# Patient Record
Sex: Female | Born: 1995
Health system: Southern US, Community
[De-identification: ages and names within clinical notes are randomized; demographics above are authoritative.]

## PROBLEM LIST (undated history)

## (undated) DIAGNOSIS — F419 Anxiety disorder, unspecified: Secondary | ICD-10-CM

## (undated) DIAGNOSIS — M51369 Other intervertebral disc degeneration, lumbar region without mention of lumbar back pain or lower extremity pain: Secondary | ICD-10-CM

## (undated) DIAGNOSIS — F32A Depression, unspecified: Secondary | ICD-10-CM

## (undated) DIAGNOSIS — N946 Dysmenorrhea, unspecified: Secondary | ICD-10-CM

## (undated) DIAGNOSIS — M5136 Other intervertebral disc degeneration, lumbar region: Secondary | ICD-10-CM

## (undated) DIAGNOSIS — R519 Headache, unspecified: Secondary | ICD-10-CM

## (undated) DIAGNOSIS — R51 Headache: Secondary | ICD-10-CM

## (undated) HISTORY — DX: Other intervertebral disc degeneration, lumbar region: M51.36

## (undated) HISTORY — DX: Depression, unspecified: F32.A

## (undated) HISTORY — DX: Other intervertebral disc degeneration, lumbar region without mention of lumbar back pain or lower extremity pain: M51.369

## (undated) HISTORY — DX: Headache, unspecified: R51.9

## (undated) HISTORY — DX: Headache: R51

## (undated) HISTORY — DX: Anxiety disorder, unspecified: F41.9

## (undated) HISTORY — DX: Dysmenorrhea, unspecified: N94.6

---
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
## 2015-01-02 ENCOUNTER — Ambulatory Visit: Payer: Managed Care, Other (non HMO)

## 2015-01-02 ENCOUNTER — Encounter: Payer: Self-pay | Admitting: *Deleted

## 2015-01-04 ENCOUNTER — Other Ambulatory Visit: Payer: Self-pay | Admitting: Obstetrics and Gynecology

## 2015-01-04 ENCOUNTER — Ambulatory Visit (INDEPENDENT_AMBULATORY_CARE_PROVIDER_SITE_OTHER): Payer: Managed Care, Other (non HMO) | Admitting: Obstetrics and Gynecology

## 2015-01-04 ENCOUNTER — Telehealth: Payer: Self-pay | Admitting: *Deleted

## 2015-01-04 VITALS — BP 112/62 | HR 99 | Wt 162.1 lb

## 2015-01-04 DIAGNOSIS — L709 Acne, unspecified: Secondary | ICD-10-CM | POA: Insufficient documentation

## 2015-01-04 DIAGNOSIS — Z3049 Encounter for surveillance of other contraceptives: Secondary | ICD-10-CM

## 2015-01-04 DIAGNOSIS — Z3042 Encounter for surveillance of injectable contraceptive: Secondary | ICD-10-CM

## 2015-01-04 MED ORDER — MEDROXYPROGESTERONE ACETATE 150 MG/ML IM SUSP
150.0000 mg | Freq: Once | INTRAMUSCULAR | Status: AC
  Administered 2015-01-04: 150 mg via INTRAMUSCULAR

## 2015-01-04 MED ORDER — CLINDAMYCIN PHOSPHATE 1 % EX GEL: Freq: Two times a day (BID) | CUTANEOUS | Status: DC

## 2015-01-04 NOTE — Progress Notes (Cosign Needed)
Pt is here for depo provera inj She is doing well, has had some increased acne on her face would like rx for that if possible

## 2015-01-04 NOTE — Telephone Encounter (Signed)
Please let her know it was sent in- a gel to be applied at bedtime and washed off in the morning, can reuse in am if needed, but sometimes it dries skin too much then.

## 2015-01-04 NOTE — Telephone Encounter (Signed)
Notified pt she voiced understanding 

## 2015-01-04 NOTE — Telephone Encounter (Signed)
Hey Virginia Jackson is having some increased acne on her face would like something sent in for this Maybe a cream instead of pill???

## 2015-01-09 ENCOUNTER — Telehealth: Payer: Self-pay | Admitting: Obstetrics and Gynecology

## 2015-01-09 NOTE — Telephone Encounter (Signed)
Virginia Jackson got the depo inj last week. She is bleeding heavier since she got it and wanted to know if this is normal. Please call after 1 pm and leave a msg if she don't answer.

## 2015-01-23 ENCOUNTER — Telehealth: Payer: Self-pay | Admitting: Obstetrics and Gynecology

## 2015-01-23 NOTE — Telephone Encounter (Signed)
Virginia Jackson gave her some gel for her acne. It's not working, Virginia Jackson said she would go to something stronger or somethng else.

## 2015-01-23 NOTE — Telephone Encounter (Signed)
[facility] [place]

## 2015-01-25 NOTE — Telephone Encounter (Signed)
Bleeding unfortunately can be normal, if she does it again after next shot, I want her to get them a week earlier. For the acne medicine, I think she needs to see a dermatologist if the meds aren't helping at all. i can send in a referral if she desires.

## 2015-01-27 ENCOUNTER — Other Ambulatory Visit: Payer: Self-pay | Admitting: Neurology

## 2015-01-27 NOTE — Telephone Encounter (Signed)
Notified pt. 

## 2015-01-27 NOTE — Telephone Encounter (Signed)
Rx sent 

## 2015-02-20 NOTE — Telephone Encounter (Signed)
Advised pt she needs to see dermatology

## 2015-03-22 ENCOUNTER — Other Ambulatory Visit: Payer: Self-pay | Admitting: Obstetrics and Gynecology

## 2015-03-24 ENCOUNTER — Ambulatory Visit: Payer: Managed Care, Other (non HMO)

## 2015-03-27 ENCOUNTER — Ambulatory Visit (INDEPENDENT_AMBULATORY_CARE_PROVIDER_SITE_OTHER): Payer: Managed Care, Other (non HMO) | Admitting: Obstetrics and Gynecology

## 2015-03-27 VITALS — BP 124/78 | HR 88 | Wt 162.9 lb

## 2015-03-27 DIAGNOSIS — Z3049 Encounter for surveillance of other contraceptives: Secondary | ICD-10-CM

## 2015-03-27 DIAGNOSIS — Z3042 Encounter for surveillance of injectable contraceptive: Secondary | ICD-10-CM

## 2015-03-27 MED ORDER — MEDROXYPROGESTERONE ACETATE 150 MG/ML IM SUSP
150.0000 mg | Freq: Once | INTRAMUSCULAR | Status: AC
  Administered 2015-03-27: 150 mg via INTRAMUSCULAR

## 2015-03-27 NOTE — Progress Notes (Cosign Needed)
Pt is here for wt, bp check, b-12 inj Denies any s/e, she is doing well, [place]

## 2015-06-07 ENCOUNTER — Other Ambulatory Visit: Payer: Self-pay | Admitting: Neurology

## 2015-06-07 NOTE — Telephone Encounter (Signed)
Last OV: 12/21/14 Next OV: 07/21/15

## 2015-06-21 ENCOUNTER — Other Ambulatory Visit (INDEPENDENT_AMBULATORY_CARE_PROVIDER_SITE_OTHER): Payer: 59

## 2015-06-21 ENCOUNTER — Ambulatory Visit (INDEPENDENT_AMBULATORY_CARE_PROVIDER_SITE_OTHER): Payer: 59 | Admitting: Neurology

## 2015-06-21 ENCOUNTER — Encounter: Payer: Self-pay | Admitting: Neurology

## 2015-06-21 VITALS — BP 116/74 | HR 92 | Ht 63.0 in | Wt 159.0 lb

## 2015-06-21 DIAGNOSIS — G43009 Migraine without aura, not intractable, without status migrainosus: Secondary | ICD-10-CM

## 2015-06-21 LAB — BASIC METABOLIC PANEL
BUN: 19 mg/dL (ref 6–23)
CO2: 21 mEq/L (ref 19–32)
Calcium: 9.6 mg/dL (ref 8.4–10.5)
Chloride: 110 mEq/L (ref 96–112)
Creatinine, Ser: 0.96 mg/dL (ref 0.40–1.20)
GFR: 95.95 mL/min (ref >60.00)
Glucose, Bld: 91 mg/dL (ref 70–99)
Potassium: 4 mEq/L (ref 3.5–5.1)
Sodium: 139 mEq/L (ref 135–145)

## 2015-06-21 MED ORDER — ZOLMITRIPTAN 5 MG PO TABS: ORAL_TABLET | ORAL | Status: DC

## 2015-06-21 MED ORDER — TOPIRAMATE 50 MG PO TABS: 50.0000 mg | ORAL_TABLET | Freq: Every day | ORAL | Status: DC

## 2015-06-21 NOTE — Progress Notes (Addendum)
NEUROLOGY FOLLOW UP OFFICE NOTE  Virginia Jackson [id number]  HISTORY OF PRESENT ILLNESS: Virginia Jackson is a 20 year old right-handed female with anxiety who follows up for chronic migraine.  She is accompanied by her mother who provides some history.  UPDATE: She has not had any significant headache (mild or severe) since January.  She will have a very dull headache once in a while Current abortive medication:  Zomig 5mg  NS (it makes her feel sick) Anticonvulsant medications:  topiramate 50mg  Other medication:  Xanax   Caffeine:  no Alcohol:  no Smoker:  no Diet:  Eats healthy.  Drinks water Exercise:  routine Depression/stress:  stress Sleep hygiene:  Usually sleeps well but sleeps often during day because of headache  HISTORY: Onset:  childhood Location:  Varies (frontal, either side) Quality:  pounding Initial Intensity:  10/10 Aura:  Sometimes sees spots in her vision prior to headache onset Prodrome:  on Associated symptoms:  Nausea, photophobia, phonophobia;  Vomiting when severe. Initial Duration:  Until she falls asleep.  Severe episodes last 1 to 3 days Initial Frequency:  Daily.  Severe episodes occur once a month to once every other month. Triggers/exacerbating factors:  movement Relieving factors:  Sleep, vomiting, hot shower Activity:  Cannot function when severe (once a month to once every other month)  Past abortive medication:  Maxalt, sumatriptan po, Advil, Advil Migraine, Fioricet Past preventative medication:  none Other past therapy:  none  Current abortive medication:  Fioricet (daily), sometimes ibuprofen 600mg  Current preventative mediction:  Citalopram 10mg  Other therapy:  alprazolam 0.5mg  Other medication:  medroprogesterone acetate  Family history of headache:  mom  PAST MEDICAL HISTORY: Past Medical History  Diagnosis Date  . Headache   . Painful menstrual periods   . Anxiety     MEDICATIONS: Current Outpatient Prescriptions on  File Prior to Visit  Medication Sig Dispense Refill  . ALPRAZolam (XANAX) 0.5 MG tablet Reported on 03/27/2015  0  . clindamycin (CLINDAGEL) 1 % gel Apply topically 2 (two) times daily. 30 g 2  . ibuprofen (ADVIL,MOTRIN) 600 MG tablet Take 600 mg by mouth every 6 (six) hours as needed.    . medroxyPROGESTERone (DEPO-PROVERA) 150 MG/ML injection   0   No current facility-administered medications on file prior to visit.    ALLERGIES: No Known Allergies  FAMILY HISTORY: Family History  Problem Relation Age of Onset  . Hypertension Maternal Grandmother   . Hypertension Maternal Grandfather   . Cancer Maternal Grandfather     lymphoma   . Cancer Paternal Grandmother     ovarian   . Aneurysm Paternal Grandfather     SOCIAL HISTORY: Social History   Social History  . Marital Status: Single    Spouse Name: N/A  . Number of Children: N/A  . Years of Education: N/A   Occupational History  . Not on file.   Social History Main Topics  . Smoking status: Never Smoker   . Smokeless tobacco: Never Used  . Alcohol Use: No  . Drug Use: No  . Sexual Activity:    Partners: Male    Birth Control/ Protection: Injection   Other Topics Concern  . Not on file   Social History Narrative    REVIEW OF SYSTEMS: Constitutional: No fevers, chills, or sweats, no generalized fatigue, change in appetite Eyes: No visual changes, double vision, eye pain Ear, nose and throat: No hearing loss, ear pain, nasal congestion, sore throat Cardiovascular: No chest pain, palpitations Respiratory:  No shortness of breath at rest or with exertion, wheezes GastrointestinaI: No nausea, vomiting, diarrhea, abdominal pain, fecal incontinence Genitourinary:  No dysuria, urinary retention or frequency Musculoskeletal:  No neck pain, back pain Integumentary: No rash, pruritus, skin lesions Neurological: as above Psychiatric: No depression, insomnia, anxiety Endocrine: No palpitations, fatigue, diaphoresis,  mood swings, change in appetite, change in weight, increased thirst Hematologic/Lymphatic:  No anemia, purpura, petechiae. Allergic/Immunologic: no itchy/runny eyes, nasal congestion, recent allergic reactions, rashes  PHYSICAL EXAM: Filed Vitals:   06/21/15 1339  BP: 116/74  Pulse: 92   General: No acute distress.  Patient appears well-groomed.  normal body habitus. Head:  Normocephalic/atraumatic Eyes:  Fundoscopic exam unremarkable without vessel changes, exudates, hemorrhages or papilledema. Neck: supple, no paraspinal tenderness, full range of motion Heart:  Regular rate and rhythm Lungs:  Clear to auscultation bilaterally Back: No paraspinal tenderness Neurological Exam: alert and oriented to person, place, and time. Attention span and concentration intact, recent and remote memory intact, fund of knowledge intact.  Speech fluent and not dysarthric, language intact.  CN II-XII intact. Fundoscopic exam unremarkable without vessel changes, exudates, hemorrhages or papilledema.  Bulk and tone normal, muscle strength 5/5 throughout.  Sensation to light touch, temperature and vibration intact.  Deep tendon reflexes 2+ throughout, toes downgoing.  Finger to nose and heel to shin testing intact.  Gait normal, Romberg negative.  IMPRESSION: Migraine without aura  PLAN: 1.  Continue topiramate  at bedtime.  Also take folic acid  daily.  Advised not to get pregnant.  Check BMP 2.  Will try the Zomig  tablet instead (since the spray is foul tasting) 3.  Follow up in 6 months.  15 minutes spent face to face with patient, over 50% spent discussing management.  [name], DO  CC:  [facility] [place]

## 2015-06-21 NOTE — Patient Instructions (Addendum)
1.  Continue topiramate 50mg  at bedtime.  I like to tell patients not to get pregnant while taking this medication.  Also, I recommend taking folic acid 1mg  daily. 2.  I would also like to check routine blood work (BMP) 3.  Instead of the Zomig spray, we will try the 5mg  pill.  Take 1 pill at earliest onset of headache.  May repeat dose once after 2 hours if needed.  Do not exceed 2 pills in 24 hours. 4.  Follow up in 6 months.

## 2015-06-22 ENCOUNTER — Ambulatory Visit (INDEPENDENT_AMBULATORY_CARE_PROVIDER_SITE_OTHER): Payer: 59 | Admitting: Obstetrics and Gynecology

## 2015-06-22 VITALS — BP 123/73 | HR 84 | Wt 159.6 lb

## 2015-06-22 DIAGNOSIS — Z3042 Encounter for surveillance of injectable contraceptive: Secondary | ICD-10-CM

## 2015-06-22 DIAGNOSIS — Z3049 Encounter for surveillance of other contraceptives: Secondary | ICD-10-CM

## 2015-06-22 MED ORDER — MEDROXYPROGESTERONE ACETATE 150 MG/ML IM SUSP
150.0000 mg | Freq: Once | INTRAMUSCULAR | Status: AC
  Administered 2015-06-22: 150 mg via INTRAMUSCULAR

## 2015-06-22 NOTE — Progress Notes (Signed)
Pt is here for depo provera inj She is doing well  

## 2015-06-23 ENCOUNTER — Ambulatory Visit: Payer: Managed Care, Other (non HMO) | Admitting: Neurology

## 2015-07-03 ENCOUNTER — Telehealth: Payer: Self-pay | Admitting: Obstetrics and Gynecology

## 2015-07-03 ENCOUNTER — Telehealth: Payer: Self-pay | Admitting: Neurology

## 2015-07-03 MED ORDER — ELETRIPTAN HYDROBROMIDE 40 MG PO TABS: ORAL_TABLET | ORAL | Status: DC

## 2015-07-03 NOTE — Telephone Encounter (Signed)
Virginia BarthelKennedy Jackson 12-04-1995 called to say that her headaches are not getting any better, she has had one for about a week. She thinks the medication that she was given is not helping. Her number is [phone]. Thank you

## 2015-07-03 NOTE — Telephone Encounter (Signed)
Pt called to report that her Zomig is not helping. States that it does not help at all. Pt has only taken one dose (did not repeat in 2 hours) due to ineffectiveness. Please advise.

## 2015-07-03 NOTE — Telephone Encounter (Signed)
Patient called with a question about her Tramadol. She had a headache and took 1 tramadol which didn't help, she wanted to know if she could take 2. Thanks

## 2015-07-03 NOTE — Telephone Encounter (Signed)
Pt aware RX sent in.

## 2015-07-03 NOTE — Telephone Encounter (Signed)
Notified pt. 

## 2015-07-03 NOTE — Telephone Encounter (Signed)
Let's try relpax 40mg  at earliest onset of headache and may increase dose once in 2 hours if needed (no more than 2 doses in 24 hours)

## 2015-07-28 ENCOUNTER — Encounter: Payer: Self-pay | Admitting: Obstetrics and Gynecology

## 2015-07-28 ENCOUNTER — Ambulatory Visit (INDEPENDENT_AMBULATORY_CARE_PROVIDER_SITE_OTHER): Payer: 59 | Admitting: Obstetrics and Gynecology

## 2015-07-28 VITALS — BP 134/72 | HR 103 | Wt 159.2 lb

## 2015-07-28 DIAGNOSIS — N9489 Other specified conditions associated with female genital organs and menstrual cycle: Secondary | ICD-10-CM | POA: Diagnosis not present

## 2015-07-28 DIAGNOSIS — B373 Candidiasis of vulva and vagina: Secondary | ICD-10-CM | POA: Diagnosis not present

## 2015-07-28 DIAGNOSIS — N898 Other specified noninflammatory disorders of vagina: Secondary | ICD-10-CM

## 2015-07-28 DIAGNOSIS — B3731 Acute candidiasis of vulva and vagina: Secondary | ICD-10-CM

## 2015-07-28 MED ORDER — FLUCONAZOLE 150 MG PO TABS: 150.0000 mg | ORAL_TABLET | Freq: Once | ORAL | Status: DC

## 2015-07-28 NOTE — Progress Notes (Signed)
Subjective:     Patient ID: Virginia Jackson, female   DOB: December 31, 1995, 20 y.o.   MRN: [id number]  HPI Concerned about increased vaginal odor when sweating over the last year; sometimes noted after sex.   Review of Systems Denies itching or irritation.     Objective:   Physical Exam A&O x4  well groomed female in no distress  Blood pressure 134/72, pulse 103, weight 159 lb 3.2 oz (72.213 kg). Pelvic exam: normal external genitalia, vulva, vagina, cervix, uterus and adnexa, WET MOUNT done - results: negative for pathogens, normal epithelial cells, lactobacilli. PH 4.5    Assessment:     Vaginal odor Yeast infection     Plan:     Summer's eve vaginal wipes as needed Diflucan 150mg  x 1 dose. RTC as needed Counseled on common causes of pH changes and odor.  Virginia Jackson, CNM

## 2015-08-04 ENCOUNTER — Ambulatory Visit: Payer: 59 | Admitting: Obstetrics and Gynecology

## 2015-09-18 ENCOUNTER — Ambulatory Visit: Payer: 59

## 2015-09-18 ENCOUNTER — Ambulatory Visit (INDEPENDENT_AMBULATORY_CARE_PROVIDER_SITE_OTHER): Payer: 59 | Admitting: Obstetrics and Gynecology

## 2015-09-18 VITALS — BP 125/68 | HR 80 | Wt 153.6 lb

## 2015-09-18 DIAGNOSIS — Z3042 Encounter for surveillance of injectable contraceptive: Secondary | ICD-10-CM

## 2015-09-18 DIAGNOSIS — Z3049 Encounter for surveillance of other contraceptives: Secondary | ICD-10-CM

## 2015-09-18 MED ORDER — MEDROXYPROGESTERONE ACETATE 150 MG/ML IM SUSP
150.0000 mg | Freq: Once | INTRAMUSCULAR | Status: AC
  Administered 2015-09-18: 150 mg via INTRAMUSCULAR

## 2015-09-18 NOTE — Progress Notes (Signed)
Patient ID: Virginia RochesterKennedy A Subramaniam, female   DOB: 1995/07/29, 20 y.o.   MRN: [id number] Pt present for Depo-provera injection. No c/o side effects. Pt due for annual and has one refill left. To make annual appt and if appt has to made out further, pt aware we can refill rx.

## 2015-09-19 ENCOUNTER — Telehealth: Payer: Self-pay | Admitting: Obstetrics and Gynecology

## 2015-09-19 NOTE — Telephone Encounter (Signed)
PT WAS HERE ON Monday 7/10 FOR HER DEPO SHOT AND WAS TOLD BY DEBBIE THAT SHE NEEDED AN AE, SHE DOES NOT HAVE ONE SCHEDULED FOR THIS YEAR BUT HAS ONE SCHEDULE FOR 06/28/2016 NEXT YEAR, IF SHE NEEDS ONE THIS YEAR SHE WANTED TO GET IN BEFORE SHE WENT BACK TO [employer], SHE LEAVES 10/26/15, SO BEFORE THAT, THERE IS SOME OPENINGS BUT NOT THE TIME FRAME WERE [facility] LIKES TO DO THEM, I TOLD THEM I WOULD HAVE TO GET THE OK FROM YOU OR [facility] IN ORDER TO MAKE THE APPT, WILL YOU LOOK AND SEE IF ITS OK TO GET HER IN BEFORE SHE GOES BACK AND LET ME KNOW AND I CAN SCHEDULE IT AND CALL HER. ALSO SHE WANTS TO GET HER DEPO DONE AT [employer] SO SHE DOESN'T HAVE TO DRIVE BACK TO [place]IN[place] HOU[place] AWAY, HER QUESTION IS WHO CAN DO IT, I TOLD HER TO CHECK WITH THE CAMPUS NURSE, BUT IF YOU COULD TALK TO HER ABOUT THAT SHE WANTED TO KNOW IF A PHARMACY CAN GIVE HER THE DEPO AND SHE ISNT SURE WHICH PHARMACY TO HAVE THE SEPT SENT TO, BUT HER NEXT DEPO IS 9/25-10/9..Marland Kitchen

## 2015-09-20 ENCOUNTER — Telehealth: Payer: Self-pay | Admitting: Neurology

## 2015-09-20 MED ORDER — TOPIRAMATE 50 MG PO TABS: 50.0000 mg | ORAL_TABLET | Freq: Every day | ORAL | Status: DC

## 2015-09-20 NOTE — Telephone Encounter (Signed)
1. Continue topiramate 50mg  at bedtime. Also take folic acid 1mg  daily. Advised not to get pregnant. Check BMP   90 Day supply sent in.

## 2015-09-20 NOTE — Telephone Encounter (Signed)
Virginia Jackson called needing a refill on her Topamax. Her # is  [phone]. [facility] [address] Thank you

## 2015-09-29 ENCOUNTER — Telehealth: Payer: Self-pay | Admitting: Obstetrics and Gynecology

## 2015-09-29 NOTE — Telephone Encounter (Signed)
Patient called requesting an appointment for her annual before she leaves for college on 10/26/15.

## 2015-10-10 NOTE — Telephone Encounter (Signed)
See other message

## 2015-10-10 NOTE — Telephone Encounter (Signed)
Spoke with pt she is going to get annual in oct

## 2015-10-23 ENCOUNTER — Other Ambulatory Visit: Payer: Self-pay | Admitting: *Deleted

## 2015-10-23 ENCOUNTER — Telehealth: Payer: Self-pay | Admitting: Obstetrics and Gynecology

## 2015-10-23 NOTE — Telephone Encounter (Signed)
Virginia [facility] [employer] [employer] [facility] [employer] bc [facility] are going to give it to her there. She's coming Wed for her AE. Can you have it ready then. Or tell me what to print and I can do it sorry I wasn't thinking

## 2015-10-23 NOTE — Telephone Encounter (Signed)
Note was created and will give to pt at annual exam

## 2015-10-25 ENCOUNTER — Ambulatory Visit (INDEPENDENT_AMBULATORY_CARE_PROVIDER_SITE_OTHER): Payer: 59 | Admitting: Obstetrics and Gynecology

## 2015-10-25 ENCOUNTER — Encounter: Payer: Self-pay | Admitting: Obstetrics and Gynecology

## 2015-10-25 VITALS — BP 122/82 | HR 102 | Ht 63.0 in | Wt 157.9 lb

## 2015-10-25 DIAGNOSIS — Z8041 Family history of malignant neoplasm of ovary: Secondary | ICD-10-CM

## 2015-10-25 DIAGNOSIS — N911 Secondary amenorrhea: Secondary | ICD-10-CM | POA: Diagnosis not present

## 2015-10-25 DIAGNOSIS — Z01419 Encounter for gynecological examination (general) (routine) without abnormal findings: Secondary | ICD-10-CM

## 2015-10-25 MED ORDER — MEDROXYPROGESTERONE ACETATE 150 MG/ML IM SUSP: 150.0000 mg | INTRAMUSCULAR | 4 refills | Status: DC

## 2015-10-25 NOTE — Patient Instructions (Signed)
Preventive Care for Adults, Female A healthy lifestyle and preventive care can promote health and wellness. Preventive health guidelines for women include the following key practices.  A routine yearly physical is a good way to check with your health care provider about your health and preventive screening. It is a chance to share any concerns and updates on your health and to receive a thorough exam.  Visit your dentist for a routine exam and preventive care every 6 months. Brush your teeth twice a day and floss once a day. Good oral hygiene prevents tooth decay and gum disease.  The frequency of eye exams is based on your age, health, family medical history, use of contact lenses, and other factors. Follow your health care provider's recommendations for frequency of eye exams.  Eat a healthy diet. Foods like vegetables, fruits, whole grains, low-fat dairy products, and lean protein foods contain the nutrients you need without too many calories. Decrease your intake of foods high in solid fats, added sugars, and salt. Eat the right amount of calories for you.Get information about a proper diet from your health care provider, if necessary.  Regular physical exercise is one of the most important things you can do for your health. Most adults should get at least 150 minutes of moderate-intensity exercise (any activity that increases your heart rate and causes you to sweat) each week. In addition, most adults need muscle-strengthening exercises on 2 or more days a week.  Maintain a healthy weight. The body mass index (BMI) is a screening tool to identify possible weight problems. It provides an estimate of body fat based on height and weight. Your health care provider can find your BMI and can help you achieve or maintain a healthy weight.For adults 20 years and older:  A BMI below 18.5 is considered underweight.  A BMI of 18.5 to 24.9 is normal.  A BMI of 25 to 29.9 is considered  overweight.  A BMI of 30 and above is considered obese.  Maintain normal blood lipids and cholesterol levels by exercising and minimizing your intake of saturated fat. Eat a balanced diet with plenty of fruit and vegetables. Blood tests for lipids and cholesterol should begin at age 64 and be repeated every 5 years. If your lipid or cholesterol levels are high, you are over 50, or you are at high risk for heart disease, you may need your cholesterol levels checked more frequently.Ongoing high lipid and cholesterol levels should be treated with medicines if diet and exercise are not working.  If you smoke, find out from your health care provider how to quit. If you do not use tobacco, do not start.  Lung cancer screening is recommended for adults aged 52-80 years who are at high risk for developing lung cancer because of a history of smoking. A yearly low-dose CT scan of the lungs is recommended for people who have at least a 30-pack-year history of smoking and are a current smoker or have quit within the past 15 years. A pack year of smoking is smoking an average of 1 pack of cigarettes a day for 1 year (for example: 1 pack a day for 30 years or 2 packs a day for 15 years). Yearly screening should continue until the smoker has stopped smoking for at least 15 years. Yearly screening should be stopped for people who develop a health problem that would prevent them from having lung cancer treatment.  If you are pregnant, do not drink alcohol. If you are  breastfeeding, be very cautious about drinking alcohol. If you are not pregnant and choose to drink alcohol, do not have more than 1 drink per day. One drink is considered to be 12 ounces (355 mL) of beer, 5 ounces (148 mL) of wine, or 1.5 ounces (44 mL) of liquor.  Avoid use of street drugs. Do not share needles with anyone. Ask for help if you need support or instructions about stopping the use of drugs.  High blood pressure causes heart disease and  increases the risk of stroke. Your blood pressure should be checked at least every 1 to 2 years. Ongoing high blood pressure should be treated with medicines if weight loss and exercise do not work.  If you are 25-78 years old, ask your health care provider if you should take aspirin to prevent strokes.  Diabetes screening is done by taking a blood sample to check your blood glucose level after you have not eaten for a certain period of time (fasting). If you are not overweight and you do not have risk factors for diabetes, you should be screened once every 3 years starting at age 86. If you are overweight or obese and you are 3-87 years of age, you should be screened for diabetes every year as part of your cardiovascular risk assessment.  Breast cancer screening is essential preventive care for women. You should practice "breast self-awareness." This means understanding the normal appearance and feel of your breasts and may include breast self-examination. Any changes detected, no matter how small, should be reported to a health care provider. Women in their 66s and 30s should have a clinical breast exam (CBE) by a health care provider as part of a regular health exam every 1 to 3 years. After age 43, women should have a CBE every year. Starting at age 37, women should consider having a mammogram (breast X-ray test) every year. Women who have a family history of breast cancer should talk to their health care provider about genetic screening. Women at a high risk of breast cancer should talk to their health care providers about having an MRI and a mammogram every year.  Breast cancer gene (BRCA)-related cancer risk assessment is recommended for women who have family members with BRCA-related cancers. BRCA-related cancers include breast, ovarian, tubal, and peritoneal cancers. Having family members with these cancers may be associated with an increased risk for harmful changes (mutations) in the breast  cancer genes BRCA1 and BRCA2. Results of the assessment will determine the need for genetic counseling and BRCA1 and BRCA2 testing.  Your health care provider may recommend that you be screened regularly for cancer of the pelvic organs (ovaries, uterus, and vagina). This screening involves a pelvic examination, including checking for microscopic changes to the surface of your cervix (Pap test). You may be encouraged to have this screening done every 3 years, beginning at age 78.  For women ages 79-65, health care providers may recommend pelvic exams and Pap testing every 3 years, or they may recommend the Pap and pelvic exam, combined with testing for human papilloma virus (HPV), every 5 years. Some types of HPV increase your risk of cervical cancer. Testing for HPV may also be done on women of any age with unclear Pap test results.  Other health care providers may not recommend any screening for nonpregnant women who are considered low risk for pelvic cancer and who do not have symptoms. Ask your health care provider if a screening pelvic exam is right for  you.  If you have had past treatment for cervical cancer or a condition that could lead to cancer, you need Pap tests and screening for cancer for at least 20 years after your treatment. If Pap tests have been discontinued, your risk factors (such as having a new sexual partner) need to be reassessed to determine if screening should resume. Some women have medical problems that increase the chance of getting cervical cancer. In these cases, your health care provider may recommend more frequent screening and Pap tests.  Colorectal cancer can be detected and often prevented. Most routine colorectal cancer screening begins at the age of 50 years and continues through age 75 years. However, your health care provider may recommend screening at an earlier age if you have risk factors for colon cancer. On a yearly basis, your health care provider may provide  home test kits to check for hidden blood in the stool. Use of a small camera at the end of a tube, to directly examine the colon (sigmoidoscopy or colonoscopy), can detect the earliest forms of colorectal cancer. Talk to your health care provider about this at age 50, when routine screening begins. Direct exam of the colon should be repeated every 5-10 years through age 75 years, unless early forms of precancerous polyps or small growths are found.  People who are at an increased risk for hepatitis B should be screened for this virus. You are considered at high risk for hepatitis B if:  You were born in a country where hepatitis B occurs often. Talk with your health care provider about which countries are considered high risk.  Your parents were born in a high-risk country and you have not received a shot to protect against hepatitis B (hepatitis B vaccine).  You have HIV or AIDS.  You use needles to inject street drugs.  You live with, or have sex with, someone who has hepatitis B.  You get hemodialysis treatment.  You take certain medicines for conditions like cancer, organ transplantation, and autoimmune conditions.  Hepatitis C blood testing is recommended for all people born from 1945 through 1965 and any individual with known risks for hepatitis C.  Practice safe sex. Use condoms and avoid high-risk sexual practices to reduce the spread of sexually transmitted infections (STIs). STIs include gonorrhea, chlamydia, syphilis, trichomonas, herpes, HPV, and human immunodeficiency virus (HIV). Herpes, HIV, and HPV are viral illnesses that have no cure. They can result in disability, cancer, and death.  You should be screened for sexually transmitted illnesses (STIs) including gonorrhea and chlamydia if:  You are sexually active and are younger than 24 years.  You are older than 24 years and your health care provider tells you that you are at risk for this type of infection.  Your sexual  activity has changed since you were last screened and you are at an increased risk for chlamydia or gonorrhea. Ask your health care provider if you are at risk.  If you are at risk of being infected with HIV, it is recommended that you take a prescription medicine daily to prevent HIV infection. This is called preexposure prophylaxis (PrEP). You are considered at risk if:  You are sexually active and do not regularly use condoms or know the HIV status of your partner(s).  You take drugs by injection.  You are sexually active with a partner who has HIV.  Talk with your health care provider about whether you are at high risk of being infected with HIV. If   you choose to begin PrEP, you should first be tested for HIV. You should then be tested every 3 months for as long as you are taking PrEP.  Osteoporosis is a disease in which the bones lose minerals and strength with aging. This can result in serious bone fractures or breaks. The risk of osteoporosis can be identified using a bone density scan. Women ages 1 years and over and women at risk for fractures or osteoporosis should discuss screening with their health care providers. Ask your health care provider whether you should take a calcium supplement or vitamin D to reduce the rate of osteoporosis.  Menopause can be associated with physical symptoms and risks. Hormone replacement therapy is available to decrease symptoms and risks. You should talk to your health care provider about whether hormone replacement therapy is right for you.  Use sunscreen. Apply sunscreen liberally and repeatedly throughout the day. You should seek shade when your shadow is shorter than you. Protect yourself by wearing long sleeves, pants, a wide-brimmed hat, and sunglasses year round, whenever you are outdoors.  Once a month, do a whole body skin exam, using a mirror to look at the skin on your back. Tell your health care provider of new moles, moles that have irregular  borders, moles that are larger than a pencil eraser, or moles that have changed in shape or color.  Stay current with required vaccines (immunizations).  Influenza vaccine. All adults should be immunized every year.  Tetanus, diphtheria, and acellular pertussis (Td, Tdap) vaccine. Pregnant women should receive 1 dose of Tdap vaccine during each pregnancy. The dose should be obtained regardless of the length of time since the last dose. Immunization is preferred during the 27th-36th week of gestation. An adult who has not previously received Tdap or who does not know her vaccine status should receive 1 dose of Tdap. This initial dose should be followed by tetanus and diphtheria toxoids (Td) booster doses every 10 years. Adults with an unknown or incomplete history of completing a 3-dose immunization series with Td-containing vaccines should begin or complete a primary immunization series including a Tdap dose. Adults should receive a Td booster every 10 years.  Varicella vaccine. An adult without evidence of immunity to varicella should receive 2 doses or a second dose if she has previously received 1 dose. Pregnant females who do not have evidence of immunity should receive the first dose after pregnancy. This first dose should be obtained before leaving the health care facility. The second dose should be obtained 4-8 weeks after the first dose.  Human papillomavirus (HPV) vaccine. Females aged 13-26 years who have not received the vaccine previously should obtain the 3-dose series. The vaccine is not recommended for use in pregnant females. However, pregnancy testing is not needed before receiving a dose. If a female is found to be pregnant after receiving a dose, no treatment is needed. In that case, the remaining doses should be delayed until after the pregnancy. Immunization is recommended for any person with an immunocompromised condition through the age of 24 years if she did not get any or all doses  earlier. During the 3-dose series, the second dose should be obtained 4-8 weeks after the first dose. The third dose should be obtained 24 weeks after the first dose and 16 weeks after the second dose.  Zoster vaccine. One dose is recommended for adults aged 97 years or older unless certain conditions are present.  Measles, mumps, and rubella (MMR) vaccine. Adults born  before 1957 generally are considered immune to measles and mumps. Adults born in [birth year] or later should have 1 or more doses of MMR vaccine unless there is a contraindication to the vaccine or there is laboratory evidence of immunity to each of the three diseases. A routine second dose of MMR vaccine should be obtained at least 28 days after the first dose for students attending postsecondary schools, health care workers, or international travelers. People who received inactivated measles vaccine or an unknown type of measles vaccine during 1963-1967 should receive 2 doses of MMR vaccine. People who received inactivated mumps vaccine or an unknown type of mumps vaccine before 1979 and are at high risk for mumps infection should consider immunization with 2 doses of MMR vaccine. For females of childbearing age, rubella immunity should be determined. If there is no evidence of immunity, females who are not pregnant should be vaccinated. If there is no evidence of immunity, females who are pregnant should delay immunization until after pregnancy. Unvaccinated health care workers born before 60 who lack laboratory evidence of measles, mumps, or rubella immunity or laboratory confirmation of disease should consider measles and mumps immunization with 2 doses of MMR vaccine or rubella immunization with 1 dose of MMR vaccine.  Pneumococcal 13-valent conjugate (PCV13) vaccine. When indicated, a person who is uncertain of his immunization history and has no record of immunization should receive the PCV13 vaccine. All adults 61 years of age and older  should receive this vaccine. An adult aged 92 years or older who has certain medical conditions and has not been previously immunized should receive 1 dose of PCV13 vaccine. This PCV13 should be followed with a dose of pneumococcal polysaccharide (PPSV23) vaccine. Adults who are at high risk for pneumococcal disease should obtain the PPSV23 vaccine at least 8 weeks after the dose of PCV13 vaccine. Adults older than 21 years of age who have normal immune system function should obtain the PPSV23 vaccine dose at least 1 year after the dose of PCV13 vaccine.  Pneumococcal polysaccharide (PPSV23) vaccine. When PCV13 is also indicated, PCV13 should be obtained first. All adults aged 2 years and older should be immunized. An adult younger than age 30 years who has certain medical conditions should be immunized. Any person who resides in a nursing home or long-term care facility should be immunized. An adult smoker should be immunized. People with an immunocompromised condition and certain other conditions should receive both PCV13 and PPSV23 vaccines. People with human immunodeficiency virus (HIV) infection should be immunized as soon as possible after diagnosis. Immunization during chemotherapy or radiation therapy should be avoided. Routine use of PPSV23 vaccine is not recommended for American Indians, [place] Natives, or people younger than 65 years unless there are medical conditions that require PPSV23 vaccine. When indicated, people who have unknown immunization and have no record of immunization should receive PPSV23 vaccine. One-time revaccination 5 years after the first dose of PPSV23 is recommended for people aged 19-64 years who have chronic kidney failure, nephrotic syndrome, asplenia, or immunocompromised conditions. People who received 1-2 doses of PPSV23 before age 44 years should receive another dose of PPSV23 vaccine at age 83 years or later if at least 5 years have passed since the previous dose. Doses  of PPSV23 are not needed for people immunized with PPSV23 at or after age 20 years.  Meningococcal vaccine. Adults with asplenia or persistent complement component deficiencies should receive 2 doses of quadrivalent meningococcal conjugate (MenACWY-D) vaccine. The doses should be obtained  at least 2 months apart. Microbiologists working with certain meningococcal bacteria, [place] recruits, people at risk during an outbreak, and people who travel to or live in countries with a high rate of meningitis should be immunized. A first-year college student up through age 28 years who is living in a residence hall should receive a dose if she did not receive a dose on or after her 16th birthday. Adults who have certain high-risk conditions should receive one or more doses of vaccine.  Hepatitis A vaccine. Adults who wish to be protected from this disease, have certain high-risk conditions, work with hepatitis A-infected animals, work in hepatitis A research labs, or travel to or work in countries with a high rate of hepatitis A should be immunized. Adults who were previously unvaccinated and who anticipate close contact with an international adoptee during the first 60 days after arrival in the [place] from a country with a high rate of hepatitis A should be immunized.  Hepatitis B vaccine. Adults who wish to be protected from this disease, have certain high-risk conditions, may be exposed to blood or other infectious body fluids, are household contacts or sex partners of hepatitis B positive people, are clients or workers in certain care facilities, or travel to or work in countries with a high rate of hepatitis B should be immunized.  Haemophilus influenzae type b (Hib) vaccine. A previously unvaccinated person with asplenia or sickle cell disease or having a scheduled splenectomy should receive 1 dose of Hib vaccine. Regardless of previous immunization, a recipient of a hematopoietic stem cell transplant  should receive a 3-dose series 6-12 months after her successful transplant. Hib vaccine is not recommended for adults with HIV infection. Preventive Services / Frequency Ages 71 to 87 years  Blood pressure check.** / Every 3-5 years.  Lipid and cholesterol check.** / Every 5 years beginning at age 1.  Clinical breast exam.** / Every 3 years for women in their 3s and 31s.  BRCA-related cancer risk assessment.** / For women who have family members with a BRCA-related cancer (breast, ovarian, tubal, or peritoneal cancers).  Pap test.** / Every 2 years from ages 50 through 86. Every 3 years starting at age 87 through age 7 or 75 with a history of 3 consecutive normal Pap tests.  HPV screening.** / Every 3 years from ages 59 through ages 35 to 6 with a history of 3 consecutive normal Pap tests.  Hepatitis C blood test.** / For any individual with known risks for hepatitis C.  Skin self-exam. / Monthly.  Influenza vaccine. / Every year.  Tetanus, diphtheria, and acellular pertussis (Tdap, Td) vaccine.** / Consult your health care provider. Pregnant women should receive 1 dose of Tdap vaccine during each pregnancy. 1 dose of Td every 10 years.  Varicella vaccine.** / Consult your health care provider. Pregnant females who do not have evidence of immunity should receive the first dose after pregnancy.  HPV vaccine. / 3 doses over 6 months, if 72 and younger. The vaccine is not recommended for use in pregnant females. However, pregnancy testing is not needed before receiving a dose.  Measles, mumps, rubella (MMR) vaccine.** / You need at least 1 dose of MMR if you were born in 1957 or later. You may also need a 2nd dose. For females of childbearing age, rubella immunity should be determined. If there is no evidence of immunity, females who are not pregnant should be vaccinated. If there is no evidence of immunity, females who are  pregnant should delay immunization until after  pregnancy.  Pneumococcal 13-valent conjugate (PCV13) vaccine.** / Consult your health care provider.  Pneumococcal polysaccharide (PPSV23) vaccine.** / 1 to 2 doses if you smoke cigarettes or if you have certain conditions.  Meningococcal vaccine.** / 1 dose if you are age 87 to 44 years and a Market researcher living in a residence hall, or have one of several medical conditions, you need to get vaccinated against meningococcal disease. You may also need additional booster doses.  Hepatitis A vaccine.** / Consult your health care provider.  Hepatitis B vaccine.** / Consult your health care provider.  Haemophilus influenzae type b (Hib) vaccine.** / Consult your health care provider. Ages 86 to 38 years  Blood pressure check.** / Every year.  Lipid and cholesterol check.** / Every 5 years beginning at age 49 years.  Lung cancer screening. / Every year if you are aged 71-80 years and have a 30-pack-year history of smoking and currently smoke or have quit within the past 15 years. Yearly screening is stopped once you have quit smoking for at least 15 years or develop a health problem that would prevent you from having lung cancer treatment.  Clinical breast exam.** / Every year after age 51 years.  BRCA-related cancer risk assessment.** / For women who have family members with a BRCA-related cancer (breast, ovarian, tubal, or peritoneal cancers).  Mammogram.** / Every year beginning at age 18 years and continuing for as long as you are in good health. Consult with your health care provider.  Pap test.** / Every 3 years starting at age 63 years through age 37 or 57 years with a history of 3 consecutive normal Pap tests.  HPV screening.** / Every 3 years from ages 41 years through ages 76 to 23 years with a history of 3 consecutive normal Pap tests.  Fecal occult blood test (FOBT) of stool. / Every year beginning at age 36 years and continuing until age 51 years. You may not need  to do this test if you get a colonoscopy every 10 years.  Flexible sigmoidoscopy or colonoscopy.** / Every 5 years for a flexible sigmoidoscopy or every 10 years for a colonoscopy beginning at age 36 years and continuing until age 35 years.  Hepatitis C blood test.** / For all people born from [birth year] through 1965 and any individual with known risks for hepatitis C.  Skin self-exam. / Monthly.  Influenza vaccine. / Every year.  Tetanus, diphtheria, and acellular pertussis (Tdap/Td) vaccine.** / Consult your health care provider. Pregnant women should receive 1 dose of Tdap vaccine during each pregnancy. 1 dose of Td every 10 years.  Varicella vaccine.** / Consult your health care provider. Pregnant females who do not have evidence of immunity should receive the first dose after pregnancy.  Zoster vaccine.** / 1 dose for adults aged 73 years or older.  Measles, mumps, rubella (MMR) vaccine.** / You need at least 1 dose of MMR if you were born in 1957 or later. You may also need a second dose. For females of childbearing age, rubella immunity should be determined. If there is no evidence of immunity, females who are not pregnant should be vaccinated. If there is no evidence of immunity, females who are pregnant should delay immunization until after pregnancy.  Pneumococcal 13-valent conjugate (PCV13) vaccine.** / Consult your health care provider.  Pneumococcal polysaccharide (PPSV23) vaccine.** / 1 to 2 doses if you smoke cigarettes or if you have certain conditions.  Meningococcal vaccine.** /  Consult your health care provider.  Hepatitis A vaccine.** / Consult your health care provider.  Hepatitis B vaccine.** / Consult your health care provider.  Haemophilus influenzae type b (Hib) vaccine.** / Consult your health care provider. Ages 80 years and over  Blood pressure check.** / Every year.  Lipid and cholesterol check.** / Every 5 years beginning at age 62 years.  Lung cancer  screening. / Every year if you are aged 32-80 years and have a 30-pack-year history of smoking and currently smoke or have quit within the past 15 years. Yearly screening is stopped once you have quit smoking for at least 15 years or develop a health problem that would prevent you from having lung cancer treatment.  Clinical breast exam.** / Every year after age 61 years.  BRCA-related cancer risk assessment.** / For women who have family members with a BRCA-related cancer (breast, ovarian, tubal, or peritoneal cancers).  Mammogram.** / Every year beginning at age 39 years and continuing for as long as you are in good health. Consult with your health care provider.  Pap test.** / Every 3 years starting at age 85 years through age 74 or 72 years with 3 consecutive normal Pap tests. Testing can be stopped between 65 and 70 years with 3 consecutive normal Pap tests and no abnormal Pap or HPV tests in the past 10 years.  HPV screening.** / Every 3 years from ages 55 years through ages 67 or 77 years with a history of 3 consecutive normal Pap tests. Testing can be stopped between 65 and 70 years with 3 consecutive normal Pap tests and no abnormal Pap or HPV tests in the past 10 years.  Fecal occult blood test (FOBT) of stool. / Every year beginning at age 81 years and continuing until age 22 years. You may not need to do this test if you get a colonoscopy every 10 years.  Flexible sigmoidoscopy or colonoscopy.** / Every 5 years for a flexible sigmoidoscopy or every 10 years for a colonoscopy beginning at age 67 years and continuing until age 22 years.  Hepatitis C blood test.** / For all people born from [birth year] through 1965 and any individual with known risks for hepatitis C.  Osteoporosis screening.** / A one-time screening for women ages 8 years and over and women at risk for fractures or osteoporosis.  Skin self-exam. / Monthly.  Influenza vaccine. / Every year.  Tetanus, diphtheria, and  acellular pertussis (Tdap/Td) vaccine.** / 1 dose of Td every 10 years.  Varicella vaccine.** / Consult your health care provider.  Zoster vaccine.** / 1 dose for adults aged 56 years or older.  Pneumococcal 13-valent conjugate (PCV13) vaccine.** / Consult your health care provider.  Pneumococcal polysaccharide (PPSV23) vaccine.** / 1 dose for all adults aged 15 years and older.  Meningococcal vaccine.** / Consult your health care provider.  Hepatitis A vaccine.** / Consult your health care provider.  Hepatitis B vaccine.** / Consult your health care provider.  Haemophilus influenzae type b (Hib) vaccine.** / Consult your health care provider. ** Family history and personal history of risk and conditions may change your health care provider's recommendations.   This information is not intended to replace advice given to you by your health care provider. Make sure you discuss any questions you have with your health care provider.   Document Released: 04/23/2001 Document Revised: 03/18/2014 Document Reviewed: 07/23/2010 Elsevier Interactive Patient Education [employer].

## 2015-10-25 NOTE — Progress Notes (Signed)
   Subjective:     Virginia Jackson is a 20 y.o. female and is here for a comprehensive physical exam. The patient reports no problems.  Social History   Social History  . Marital status: Single    Spouse name: N/A  . Number of children: N/A  . Years of education: N/A   Occupational History  . Not on file.   Social History Main Topics  . Smoking status: Never Smoker  . Smokeless tobacco: Never Used  . Alcohol use No  . Drug use: No  . Sexual activity: Yes    Partners: Male    Birth control/ protection: Injection   Other Topics Concern  . Not on file   Social History Narrative  . No narrative on file   Health Maintenance  Topic Date Due  . CHLAMYDIA SCREENING  02/20/2011  . HIV Screening  02/20/2011  . TETANUS/TDAP  02/20/2015  . INFLUENZA VACCINE  10/10/2015    The following portions of the patient's history were reviewed and updated as appropriate: allergies, current medications, past family history, past medical history, past social history, past surgical history and problem list.  Review of Systems A comprehensive review of systems was negative.   Objective:    General appearance: alert, cooperative and appears stated age Neck: no adenopathy, no carotid bruit, no JVD, supple, symmetrical, trachea midline and thyroid not enlarged, symmetric, no tenderness/mass/nodules Lungs: clear to auscultation bilaterally Breasts: normal appearance, no masses or tenderness Heart: regular rate and rhythm, S1, S2 normal, no murmur, click, rub or gallop Abdomen: soft, non-tender; bowel sounds normal; no masses,  no organomegaly Pelvic: cervix normal in appearance, external genitalia normal, no adnexal masses or tenderness, no cervical motion tenderness, rectovaginal septum normal, uterus normal size, shape, and consistency and vagina normal without discharge Extremities: extremities normal, atraumatic, no cyanosis or edema    Assessment:    Healthy female exam. Secondary  amenorrhea depo, family h/o ovarian cancer in PGM     Plan:  aptima obtained Discussed genetic screening for BRAC-info given to review, will consider for future testing Depo refilled   See After Visit Summary for Counseling Recommendations

## 2015-10-27 LAB — GC/CHLAMYDIA PROBE AMP
CHLAMYDIA, DNA PROBE: NEGATIVE
NEISSERIA GONORRHOEAE BY PCR: NEGATIVE

## 2015-12-25 ENCOUNTER — Ambulatory Visit (INDEPENDENT_AMBULATORY_CARE_PROVIDER_SITE_OTHER): Payer: 59 | Admitting: Neurology

## 2015-12-25 ENCOUNTER — Encounter: Payer: Self-pay | Admitting: Neurology

## 2015-12-25 VITALS — HR 94 | Ht 63.0 in | Wt 151.0 lb

## 2015-12-25 DIAGNOSIS — G43009 Migraine without aura, not intractable, without status migrainosus: Secondary | ICD-10-CM

## 2015-12-25 DIAGNOSIS — F411 Generalized anxiety disorder: Secondary | ICD-10-CM

## 2015-12-25 MED ORDER — ELETRIPTAN HYDROBROMIDE 40 MG PO TABS: ORAL_TABLET | ORAL | 1 refills | Status: DC

## 2015-12-25 MED ORDER — TOPIRAMATE 50 MG PO TABS: 50.0000 mg | ORAL_TABLET | Freq: Every day | ORAL | 1 refills | Status: DC

## 2015-12-25 NOTE — Progress Notes (Signed)
NEUROLOGY FOLLOW UP OFFICE NOTE  BRENLEY PRIORE [id number]  HISTORY OF PRESENT ILLNESS: Virginia Jackson is a 20 year old right-handed female with anxiety who follows up for chronic migraine.  She is accompanied by her mother who provides some history.   UPDATE: Headaches have been well-controlled until a couple of weeks ago, when they started occurring about twice a week.  They respond quickly with Relpax but usually she just will lay down to go to sleep.  She attributes the increase in headaches to increased stress that is related to her living situation with her roommate.  She has had increased anxiety and crying spells, which trigger headache. Current abortive medication:  Relpax 40mg  Anticonvulsant medications:  topiramate 50mg  Other medication:  Xanax    Caffeine:  no Alcohol:  no Smoker:  no Diet:  Eats healthy.  Drinks water Exercise:  routine Depression/stress:  stress Sleep hygiene:  Usually sleeps well but sleeps often during day because of headache   HISTORY: Onset:  childhood Location:  Varies (frontal, either side) Quality:  pounding Initial Intensity:  10/10 Aura:  Sometimes sees spots in her vision prior to headache onset Prodrome:  on Associated symptoms:  Nausea, photophobia, phonophobia;  Vomiting when severe. Initial Duration:  Until she falls asleep.  Severe episodes last 1 to 3 days Initial Frequency:  Daily.  Severe episodes occur once a month to once every other month. Triggers/exacerbating factors:  movement Relieving factors:  Sleep, vomiting, hot shower Activity:  Cannot function when severe (once a month to once every other month)   Past abortive medication:  Maxalt, sumatriptan po, Advil, Advil Migraine, Fioricet, Zomig 5mg  NS, Zomig 5mg  tablet Past preventative medication:  none Other past therapy:  none   Current abortive medication:  Fioricet (daily), sometimes ibuprofen 600mg  Current preventative mediction:  Citalopram 10mg  Other therapy:   alprazolam 0.5mg  Other medication:  medroprogesterone acetate   Family history of headache:  mom  PAST MEDICAL HISTORY: Past Medical History:  Diagnosis Date  . Anxiety   . Headache   . Painful menstrual periods     MEDICATIONS: Current Outpatient Prescriptions on File Prior to Visit  Medication Sig Dispense Refill  . ALPRAZolam (XANAX) 0.5 MG tablet Reported on 03/27/2015  0  . eletriptan (RELPAX) 40 MG tablet Take at earliest onsetMay repeat in 2 hours if headache persists or recurs. 9 tablet 3  . ibuprofen (ADVIL,MOTRIN) 600 MG tablet Take 600 mg by mouth every 6 (six) hours as needed. Reported on 07/28/2015    . medroxyPROGESTERone (DEPO-PROVERA) 150 MG/ML injection Inject 1 mL (150 mg total) into the muscle every 3 (three) months. 1 mL 4  . topiramate (TOPAMAX) 50 MG tablet Take 1 tablet (50 mg total) by mouth daily. 90 tablet 0   No current facility-administered medications on file prior to visit.     ALLERGIES: No Known Allergies  FAMILY HISTORY: Family History  Problem Relation Age of Onset  . Hypertension Maternal Grandmother   . Hypertension Maternal Grandfather   . Cancer Maternal Grandfather     lymphoma   . Cancer Paternal Grandmother     ovarian   . Aneurysm Paternal Grandfather     SOCIAL HISTORY: Social History   Social History  . Marital status: Single    Spouse name: N/A  . Number of children: N/A  . Years of education: N/A   Occupational History  . Not on file.   Social History Main Topics  . Smoking status: Never Smoker  .  Smokeless tobacco: Never Used  . Alcohol use No  . Drug use: No  . Sexual activity: Yes    Partners: Male    Birth control/ protection: Injection   Other Topics Concern  . Not on file   Social History Narrative  . No narrative on file    REVIEW OF SYSTEMS: Constitutional: No fevers, chills, or sweats, no generalized fatigue, change in appetite Eyes: No visual changes, double vision, eye pain Ear, nose and  throat: No hearing loss, ear pain, nasal congestion, sore throat Cardiovascular: No chest pain, palpitations Respiratory:  No shortness of breath at rest or with exertion, wheezes GastrointestinaI: No nausea, vomiting, diarrhea, abdominal pain, fecal incontinence Genitourinary:  No dysuria, urinary retention or frequency Musculoskeletal:  No neck pain, back pain Integumentary: No rash, pruritus, skin lesions Neurological: as above Psychiatric: No depression, insomnia, anxiety Endocrine: No palpitations, fatigue, diaphoresis, mood swings, change in appetite, change in weight, increased thirst Hematologic/Lymphatic:  No purpura, petechiae. Allergic/Immunologic: no itchy/runny eyes, nasal congestion, recent allergic reactions, rashes  PHYSICAL EXAM: Vitals:   12/25/15 1401  Pulse: 94   General: No acute distress.  Patient appears well-groomed.  normal body habitus. Head:  Normocephalic/atraumatic Eyes:  Fundi examined but not visualized Neck: supple, no paraspinal tenderness, full range of motion Heart:  Regular rate and rhythm Lungs:  Clear to auscultation bilaterally Back: No paraspinal tenderness Neurological Exam: alert and oriented to person, place, and time. Attention span and concentration intact, recent and remote memory intact, fund of knowledge intact.  Speech fluent and not dysarthric, language intact.  CN II-XII intact. Bulk and tone normal, muscle strength 5/5 throughout.  Sensation to light touch  intact.  Deep tendon reflexes 2+ throughout.  Finger to nose testing intact.  Gait normal  IMPRESSION: Migraine without aura.  Increased frequency due to social stressors at school  PLAN: 1.  Continue topiramate 50mg  at bedtime.  I again reminded her that she should take care not to get pregnant while on this medication. 2.  Advised to see a therapist at school to help address her social stressors.  If we absolutely need to, we can consider increasing topiramate. 3.  Relpax as  needed. 4.  Follow up in 6 months.  19 minutes spent face to face with patient, over 50% spent counseling.  Shon MilletAdam Ohm Dentler, DO  CC:  Harlow MaresMelody Shambley, CNM

## 2015-12-25 NOTE — Patient Instructions (Addendum)
1.  We will continue topiramate 50mg  at bedtime. 2.  Use Relpax as needed 3.  Try to find a therapist at school to discuss ways of addressing the issues with the roommate and to help address the stress. 4.  Follow up in 6 months but contact me sooner if needed.

## 2016-03-01 ENCOUNTER — Other Ambulatory Visit: Payer: Self-pay | Admitting: Obstetrics and Gynecology

## 2016-03-06 ENCOUNTER — Ambulatory Visit (INDEPENDENT_AMBULATORY_CARE_PROVIDER_SITE_OTHER): Payer: 59 | Admitting: Obstetrics and Gynecology

## 2016-03-06 VITALS — BP 139/76 | HR 115 | Ht 63.0 in | Wt 163.4 lb

## 2016-03-06 DIAGNOSIS — Z3042 Encounter for surveillance of injectable contraceptive: Secondary | ICD-10-CM | POA: Diagnosis not present

## 2016-03-06 LAB — POCT URINE PREGNANCY: Preg Test, Ur: NEGATIVE

## 2016-03-06 MED ORDER — MEDROXYPROGESTERONE ACETATE 150 MG/ML IM SUSP
150.0000 mg | Freq: Once | INTRAMUSCULAR | Status: DC
Start: 2016-03-06 — End: 2016-12-20

## 2016-03-06 MED ORDER — MEDROXYPROGESTERONE ACETATE 150 MG/ML IM SUSP
150.0000 mg | Freq: Once | INTRAMUSCULAR | Status: AC
  Administered 2016-03-06: 150 mg via INTRAMUSCULAR

## 2016-03-06 NOTE — Progress Notes (Signed)
Pt presents for a depo inj. States she had an inj in Sept 2017 at her school. She had no documentation with her. UPT done and neg result obtained. AC to give inj per pt request. Follow up between 3/14-3/28/18

## 2016-05-15 ENCOUNTER — Ambulatory Visit (INDEPENDENT_AMBULATORY_CARE_PROVIDER_SITE_OTHER): Payer: 59 | Admitting: Podiatry

## 2016-05-15 ENCOUNTER — Other Ambulatory Visit: Payer: Self-pay | Admitting: *Deleted

## 2016-05-15 ENCOUNTER — Encounter: Payer: Self-pay | Admitting: Neurology

## 2016-05-15 ENCOUNTER — Ambulatory Visit (INDEPENDENT_AMBULATORY_CARE_PROVIDER_SITE_OTHER): Payer: 59

## 2016-05-15 ENCOUNTER — Ambulatory Visit (INDEPENDENT_AMBULATORY_CARE_PROVIDER_SITE_OTHER): Payer: 59 | Admitting: Neurology

## 2016-05-15 ENCOUNTER — Encounter: Payer: Self-pay | Admitting: Podiatry

## 2016-05-15 VITALS — BP 115/74 | HR 98 | Resp 16

## 2016-05-15 VITALS — BP 100/66 | HR 85 | Ht 63.0 in | Wt 169.0 lb

## 2016-05-15 DIAGNOSIS — G43009 Migraine without aura, not intractable, without status migrainosus: Secondary | ICD-10-CM | POA: Diagnosis not present

## 2016-05-15 DIAGNOSIS — M7750 Other enthesopathy of unspecified foot: Secondary | ICD-10-CM

## 2016-05-15 DIAGNOSIS — M79672 Pain in left foot: Secondary | ICD-10-CM | POA: Diagnosis not present

## 2016-05-15 DIAGNOSIS — M79671 Pain in right foot: Secondary | ICD-10-CM | POA: Diagnosis not present

## 2016-05-15 MED ORDER — TOPIRAMATE 50 MG PO TABS: 50.0000 mg | ORAL_TABLET | Freq: Every day | ORAL | 2 refills | Status: DC

## 2016-05-15 MED ORDER — ELETRIPTAN HYDROBROMIDE 40 MG PO TABS: ORAL_TABLET | ORAL | 2 refills | Status: DC

## 2016-05-15 NOTE — Progress Notes (Signed)
   Subjective:    Patient ID: Virginia Jackson A Pocock, female    DOB: 1995/09/29, 21 y.o.   MRN: [id number]  HPI: She presents today with her mother with a chief complaint of pain to her arch and shins bilaterally. 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 She is noticed that she is getting numbness and throbbing after a lot of walking just a short distance as well. She and pain seems to be worse and she is trying to wear more favorable sneakers.    Review of Systems  Musculoskeletal: Positive for gait problem.  Neurological: Positive for dizziness, light-headedness, numbness and headaches.  All other systems reviewed and are negative.      Objective:   Physical Exam: Vital signs are stable alert and oriented 3. Pulses are palpable. Neurologic services intact. Deep tendon reflexes are intact. Muscle strength is 5 over 5 dorsiflexion plantar flexors and inverters everters all intrinsic musculature is intact. Orthopedic evaluation demonstrates pes planus bilateral she does have mild tenderness on palpation medial calcaneal tubercles. She has tenderness of the tibialis anterior tendon flexible pes planus is noted. Radiographs confirm flexible pes planus no osseous abnormalities. Cutaneous evaluation shows supple well-hydrated cutis no erythema edema cellulitis drainage or odor.     Assessment & Plan:  Assessment: Pes planus flexible in nature plantar fasciitis and shin splints.  Plan: Discussed etiology pathology insert versus surgical therapies at this point we discussed appropriate shoe gear stretching her size ice therapy and shoe modifications. I also had her scan for a new set of orthotics.

## 2016-05-15 NOTE — Progress Notes (Signed)
NEUROLOGY FOLLOW UP OFFICE NOTE  GERENE NEDD [id number]  HISTORY OF PRESENT ILLNESS: Virginia Jackson is a 21 year old right-handed female with anxiety who follows up for chronic migraine.  She is accompanied by her mother who provides some history.   UPDATE: Headaches improved.  She has a mild headache about once every 2 weeks.  She has only had one or two severe migraines since last visit. Intensity:  10/10 Duration:  1 hour with Relpax Frequency:  One or two over past 6 months Current abortive medication:  Relpax 40mg , ibuprofen Anticonvulsant medications:  topiramate 50mg  Other medication:  Xanax  The problem is that her insurance only allows her 4 Relpax tablets a month.  This causes anxiety for her because she never knows if she will have any available if she should have a severe migraine.  Otherwise, she will take ibuprofen, which is not effective.   Caffeine:  no Alcohol:  no Smoker:  no Diet:  Eats healthy.  Drinks water Exercise:  routine Depression/stress:  stress Sleep hygiene:  Usually sleeps well but sleeps often during day because of headache   HISTORY: Onset:  childhood Location:  Varies (frontal, either side) Quality:  pounding Initial Intensity:  10/10 Aura:  Sometimes sees spots in her vision prior to headache onset Prodrome:  on Associated symptoms:  Nausea, photophobia, phonophobia;  Vomiting when severe. Initial Duration:  Until she falls asleep.  Severe episodes last 1 to 3 days Initial Frequency:  Daily.  Severe episodes occur once a month to once every other month. Triggers/exacerbating factors:  movement, stress Relieving factors:  Sleep, vomiting, hot shower Activity:  Cannot function when severe (once a month to once every other month)   Past abortive medication:  Maxalt, sumatriptan po, Advil, Advil Migraine, Fioricet, Zomig 5mg  NS, Zomig 5mg  tablet Past preventative medication:  none Other past therapy:  none   Family history of headache:   mom  PAST MEDICAL HISTORY: Past Medical History:  Diagnosis Date  . Anxiety   . Headache   . Painful menstrual periods     MEDICATIONS: Current Outpatient Prescriptions on File Prior to Visit  Medication Sig Dispense Refill  . ALPRAZolam (XANAX) 0.5 MG tablet Reported on 03/27/2015  0  . ibuprofen (ADVIL,MOTRIN) 600 MG tablet Take 600 mg by mouth every 6 (six) hours as needed. Reported on 07/28/2015     Current Facility-Administered Medications on File Prior to Visit  Medication Dose Route Frequency Provider Last Rate Last Dose  . medroxyPROGESTERone (DEPO-PROVERA) injection 150 mg  150 mg Intramuscular Once Melody [employer], CNM        ALLERGIES: No Known Allergies  FAMILY HISTORY: Family History  Problem Relation Age of Onset  . Hypertension Maternal Grandmother   . Hypertension Maternal Grandfather   . Cancer Maternal Grandfather     lymphoma   . Cancer Paternal Grandmother     ovarian   . Aneurysm Paternal Grandfather   . Rheum arthritis Mother   . Hypertension Mother     SOCIAL HISTORY: Social History   Social History  . Marital status: Single    Spouse name: N/A  . Number of children: N/A  . Years of education: N/A   Occupational History  . Not on file.   Social History Main Topics  . Smoking status: Never Smoker  . Smokeless tobacco: Never Used  . Alcohol use No  . Drug use: No  . Sexual activity: Yes    Partners: Male  Birth control/ protection: Injection   Other Topics Concern  . Not on file   Social History Narrative  . No narrative on file    REVIEW OF SYSTEMS: Constitutional: No fevers, chills, or sweats, no generalized fatigue, change in appetite Eyes: No visual changes, double vision, eye pain Ear, nose and throat: No hearing loss, ear pain, nasal congestion, sore throat Cardiovascular: No chest pain, palpitations Respiratory:  No shortness of breath at rest or with exertion, wheezes GastrointestinaI: No nausea, vomiting,  diarrhea, abdominal pain, fecal incontinence Genitourinary:  No dysuria, urinary retention or frequency Musculoskeletal:  No neck pain, back pain Integumentary: No rash, pruritus, skin lesions Neurological: as above Psychiatric: No depression, insomnia, anxiety Endocrine: No palpitations, fatigue, diaphoresis, mood swings, change in appetite, change in weight, increased thirst Hematologic/Lymphatic:  No purpura, petechiae. Allergic/Immunologic: no itchy/runny eyes, nasal congestion, recent allergic reactions, rashes  PHYSICAL EXAM: Vitals:   05/15/16 1443  BP: 100/66  Pulse: 85   General: No acute distress.  Patient appears well-groomed.  normal body habitus. Head:  Normocephalic/atraumatic Eyes:  Fundi examined but not visualized Neck: supple, no paraspinal tenderness, full range of motion Heart:  Regular rate and rhythm Lungs:  Clear to auscultation bilaterally Back: No paraspinal tenderness Neurological Exam: alert and oriented to person, place, and time. Attention span and concentration intact, recent and remote memory intact, fund of knowledge intact.  Speech fluent and not dysarthric, language intact.  CN II-XII intact. Bulk and tone normal, muscle strength 5/5 throughout.  Sensation to light touch  intact.  Deep tendon reflexes 2+ throughout.  Finger to nose testing intact.  Gait normal  IMPRESSION: Migraine  PLAN: 1.  Continue topiramate 50mg  daily.  Discussed not to get pregnant while taking this medication. 2.  Relpax for abortive therapy.  Will try to appeal to insurance company to allow more pills a month. 3.  Lifestyle modification 4.  Follow up in 8 months.  Shon MilletAdam Jaffe, DO  CC:  Harlow MaresMelody Shambley, CNM

## 2016-05-15 NOTE — Patient Instructions (Signed)
Migraine Recommendations: 1.  Continue topiramate 50mg  daily 2.  Take eletriptan (Relpax) at earliest onset of headache.  May repeat dose once in 2 hours if needed.  Do not exceed two tablets in 24 hours. 3.  Limit use of pain relievers to no more than 2 days out of the week.  These medications include acetaminophen, ibuprofen, triptans and narcotics.  This will help reduce risk of rebound headaches. 4.  Be aware of common food triggers such as processed sweets, processed foods with nitrites (such as deli meat, hot dogs, sausages), foods with MSG, alcohol (such as wine), chocolate, certain cheeses, certain fruits (dried fruits, some citrus fruit), vinegar, diet soda. 4.  Avoid caffeine 5.  Routine exercise 6.  Proper sleep hygiene 7.  Stay adequately hydrated with water 8.  Keep a headache diary. 9.  Maintain proper stress management. 10.  Do not skip meals. 11.  Consider supplements:  Magnesium citrate 400mg  to 600mg  daily, riboflavin 400mg , Coenzyme Q 10 100mg  three times daily 12.  Follow up in 8 months.

## 2016-06-26 ENCOUNTER — Encounter: Payer: Self-pay | Admitting: *Deleted

## 2016-06-28 ENCOUNTER — Encounter: Payer: 59 | Admitting: Obstetrics and Gynecology

## 2016-08-13 ENCOUNTER — Ambulatory Visit (INDEPENDENT_AMBULATORY_CARE_PROVIDER_SITE_OTHER): Payer: 59 | Admitting: Obstetrics and Gynecology

## 2016-08-13 VITALS — BP 122/84 | HR 99 | Wt 178.5 lb

## 2016-08-13 DIAGNOSIS — Z3042 Encounter for surveillance of injectable contraceptive: Secondary | ICD-10-CM | POA: Diagnosis not present

## 2016-08-13 MED ORDER — MEDROXYPROGESTERONE ACETATE 150 MG/ML IM SUSP
150.0000 mg | Freq: Once | INTRAMUSCULAR | Status: AC
  Administered 2016-08-13: 150 mg via INTRAMUSCULAR

## 2016-08-13 NOTE — Progress Notes (Signed)
Pt is here for depo provera inj, she is doing well, denies any changes

## 2016-08-15 ENCOUNTER — Ambulatory Visit (INDEPENDENT_AMBULATORY_CARE_PROVIDER_SITE_OTHER): Payer: 59 | Admitting: Podiatry

## 2016-08-15 DIAGNOSIS — M722 Plantar fascial fibromatosis: Secondary | ICD-10-CM

## 2016-08-15 DIAGNOSIS — M2141 Flat foot [pes planus] (acquired), right foot: Secondary | ICD-10-CM

## 2016-08-15 DIAGNOSIS — M2142 Flat foot [pes planus] (acquired), left foot: Secondary | ICD-10-CM

## 2016-08-15 NOTE — Patient Instructions (Signed)

## 2016-08-15 NOTE — Progress Notes (Signed)
Patient ID: Virginia RochesterKennedy A Jackson, female   DOB: 06/13/1995, 20 y.o.   MRN: [id number] Patient presents for orthotic pick up.  Verbal and written break in and wear instructions given.  Patient will follow up in 4 weeks if symptoms worsen or fail to improve.

## 2016-08-27 ENCOUNTER — Other Ambulatory Visit: Payer: Self-pay | Admitting: *Deleted

## 2016-08-27 MED ORDER — ALPRAZOLAM 0.5 MG PO TABS: 0.5000 mg | ORAL_TABLET | Freq: Two times a day (BID) | ORAL | 2 refills | Status: DC | PRN

## 2016-09-09 ENCOUNTER — Ambulatory Visit: Payer: 59 | Admitting: Podiatry

## 2016-10-23 ENCOUNTER — Encounter: Payer: 59 | Admitting: Obstetrics and Gynecology

## 2016-10-24 ENCOUNTER — Encounter: Payer: Self-pay | Admitting: Certified Nurse Midwife

## 2016-10-24 ENCOUNTER — Ambulatory Visit (INDEPENDENT_AMBULATORY_CARE_PROVIDER_SITE_OTHER): Payer: 59 | Admitting: Certified Nurse Midwife

## 2016-10-24 VITALS — BP 125/78 | HR 115 | Wt 175.3 lb

## 2016-10-24 DIAGNOSIS — Z01419 Encounter for gynecological examination (general) (routine) without abnormal findings: Secondary | ICD-10-CM | POA: Diagnosis not present

## 2016-10-30 LAB — CT NG TV HSV BY NAA
Chlamydia by NAA: NEGATIVE
Gonococcus by NAA: NEGATIVE
HSV 1 NAA: NEGATIVE
HSV 2 NAA: NEGATIVE
TRICH VAG BY NAA: NEGATIVE

## 2016-10-31 NOTE — Progress Notes (Signed)
Please contact patient with NuSwab results, negative. Thanks, JML

## 2016-11-03 NOTE — Progress Notes (Signed)
ANNUAL PREVENTATIVE CARE GYN  ENCOUNTER NOTE  Subjective:       Virginia Jackson is a 21 y.o. G0P0000 female here for a routine annual gynecologic exam.  Current complaints: dyspareunia.   Patient is a Scientist, product/process development social work at [employer]  She has been sexually active, but is not in a relationship at this time.   History of dyspareunia with previous partner who was her "most endowed" partner.   Denies difficulty breathing or respiratory distress, chest pain, abdominal pain, vaginal bleeding, dysuria, and leg pain or swelling.    Gynecologic History  No LMP recorded. Patient has had an injection.  Contraception: Depo-Provera injections, will continue at [facility]  Last Pap: N/A.   Obstetric History OB History  Gravida Para Term Preterm AB Living  0 0 0 0 0 0  SAB TAB Ectopic Multiple Live Births  0 0 0 0          Past Medical History:  Diagnosis Date  . Anxiety   . Headache   . Painful menstrual periods     History reviewed. No pertinent surgical history.  Current Outpatient Prescriptions on File Prior to Visit  Medication Sig Dispense Refill  . ALPRAZolam (XANAX) 0.5 MG tablet Take 1 tablet (0.5 mg total) by mouth 2 (two) times daily as needed for anxiety. Reported on 03/27/2015 60 tablet 2  . ibuprofen (ADVIL,MOTRIN) 600 MG tablet Take 600 mg by mouth every 6 (six) hours as needed. Reported on 07/28/2015    . medroxyPROGESTERone (DEPO-PROVERA) 150 MG/ML injection Inject 150 mg into the muscle every 3 (three) months.    . topiramate (TOPAMAX) 50 MG tablet Take 1 tablet (50 mg total) by mouth daily. 90 tablet 2  . eletriptan (RELPAX) 40 MG tablet Take at earliest onset.  May repeat once in 2 hours if headache persists or recurs. 27 tablet 2   Current Facility-Administered Medications on File Prior to Visit  Medication Dose Route Frequency Provider Last Rate Last Dose  . medroxyPROGESTERone (DEPO-PROVERA) injection 150 mg  150 mg Intramuscular Once Shambley, Melody  N, CNM        No Known Allergies  Social History   Social History  . Marital status: Single    Spouse name: N/A  . Number of children: N/A  . Years of education: N/A   Occupational History  . Not on file.   Social History Main Topics  . Smoking status: Never Smoker  . Smokeless tobacco: Never Used  . Alcohol use No  . Drug use: No  . Sexual activity: Yes    Partners: Male    Birth control/ protection: Injection   Other Topics Concern  . Not on file   Social History Narrative  . No narrative on file    Family History  Problem Relation Age of Onset  . Hypertension Maternal Grandmother   . Hypertension Maternal Grandfather   . Cancer Maternal Grandfather        lymphoma   . Cancer Paternal Grandmother        ovarian   . Aneurysm Paternal Grandfather   . Rheum arthritis Mother   . Hypertension Mother     The following portions of the patient's history were reviewed and updated as appropriate: allergies, current medications, past family history, past medical history, past social history, past surgical history and problem list.  Review of Systems  ROS negative except as noted above. Information obtained from patient.    Objective:   BP 125/78  Pulse (!) 115   Wt 175 lb 4.8 oz (79.5 kg)   BMI 31.05 kg/m   CONSTITUTIONAL: Well-developed, well-nourished female in no acute distress.   PSYCHIATRIC: Normal mood and affect. Normal behavior. Normal judgment and thought content.  NEUROLGIC: Alert and oriented to person, place, and time. Normal muscle tone coordination. No cranial nerve deficit noted.  HENT:  Normocephalic, atraumatic, External right and left ear normal. Oropharynx is clear and moist  EYES: Conjunctivae and EOM are normal. Pupils are equal, round, and reactive to light. No scleral icterus.   NECK: Normal range of motion, supple, no masses.  Normal thyroid.   SKIN: Skin is warm and dry. No rash noted. Not diaphoretic. No erythema. No  pallor.  CARDIOVASCULAR: Normal heart rate noted, regular rhythm, no murmur.  RESPIRATORY: Clear to auscultation bilaterally. Effort and breath sounds normal, no problems with respiration noted.  BREASTS: Symmetric in size. No masses, skin changes, nipple drainage, or lymphadenopathy.  ABDOMEN: Soft, normal bowel sounds, no distention noted.  No tenderness, rebound or guarding.   PELVIC:  External Genitalia: Normal  BUS: Normal  Vagina: Normal  Cervix: Normal  Uterus: Normal   MUSCULOSKELETAL: Normal range of motion. No tenderness.  No cyanosis, clubbing, or edema.  2+ distal pulses.  LYMPHATIC: No Axillary, Supraclavicular, or Inguinal Adenopathy.  Assessment:   Annual gynecologic examination 21 y.o.   Contraception: Depo-Provera injections   Obesity 1   Problem List Items Addressed This Visit    None    Visit Diagnoses    Well female exam with routine gynecological exam    -  Primary   Relevant Orders   Ct Ng TV HSV by NAA (Completed)      Plan:   Labs: NuSwab collect, will contact patient with results.  Routine preventative health maintenance measures emphasized: Exercise/Diet/Weight control, Tobacco Warnings, Alcohol/Substance use risks, Stress Management, Peer Pressure Issues and Safe Sex.   Discuss use of vaginal lubricant, lidocaine jelly, and various positions during intercourse to increase comfort.   Patient desires genetic testing due to family history of ovarian cancer. Previously discussed with Galen Manila, CNM. Brochures given and encouraged patient to follow up with Melody and schedule appointment over fall break.   RTC x 1 year for annual exam or sooner if needed.    Gunnar Bulla, CNM

## 2016-11-04 ENCOUNTER — Encounter: Payer: Self-pay | Admitting: Obstetrics and Gynecology

## 2016-11-04 ENCOUNTER — Ambulatory Visit: Payer: 59

## 2016-11-04 ENCOUNTER — Other Ambulatory Visit: Payer: Self-pay | Admitting: Obstetrics and Gynecology

## 2016-11-04 NOTE — Telephone Encounter (Signed)
Pt stated that she was supposed to get her Depo shot in our office but wasn't able to get the appt scheduled prior to returning to school. Pt stated that she has an appt at the school clinic tomorrow to get her Depo shot but she needs the medication sent to [facility]. Pt is requesting an Rx for medroxyPROGESTERone (DEPO-PROVERA) injection 150 mg to be sent to [facility]. Please advise. Thanks TNP

## 2016-11-06 ENCOUNTER — Encounter: Payer: 59 | Admitting: Obstetrics and Gynecology

## 2016-11-26 ENCOUNTER — Other Ambulatory Visit: Payer: Self-pay

## 2016-11-26 ENCOUNTER — Telehealth: Payer: Self-pay

## 2016-11-26 ENCOUNTER — Telehealth: Payer: Self-pay | Admitting: Neurology

## 2016-11-26 MED ORDER — TOPIRAMATE 50 MG PO TABS: 50.0000 mg | ORAL_TABLET | Freq: Every day | ORAL | 6 refills | Status: DC

## 2016-11-26 NOTE — Telephone Encounter (Signed)
Patient needs  Refill on topamax  [facility]

## 2016-11-26 NOTE — Telephone Encounter (Signed)
Called Pt, LM for her to call pharm to contact us for refill, [facility],

## 2016-11-26 NOTE — Telephone Encounter (Signed)
[facility] called and [facility] [address] [facility] one we need to call it in to

## 2016-12-18 ENCOUNTER — Ambulatory Visit (INDEPENDENT_AMBULATORY_CARE_PROVIDER_SITE_OTHER): Payer: 59 | Admitting: Obstetrics and Gynecology

## 2016-12-18 ENCOUNTER — Encounter: Payer: Self-pay | Admitting: Obstetrics and Gynecology

## 2016-12-18 VITALS — BP 119/74 | HR 93 | Ht 63.0 in | Wt 170.0 lb

## 2016-12-18 DIAGNOSIS — Z3040 Encounter for surveillance of contraceptives, unspecified: Secondary | ICD-10-CM

## 2016-12-18 NOTE — Patient Instructions (Signed)

## 2016-12-18 NOTE — Progress Notes (Signed)
Subjective:     Patient ID: Virginia Jackson, female   DOB: 1995-09-01, 20 y.o.   MRN: [id number]  HPI Desires changing BC to IUD. Is due for next Depo in November but is home on fall break.  Review of Systems negative    Objective:   Physical Exam A&Ox4 Well groomed female in no distress Blood pressure 119/74, pulse 93, height  (1.6 m), weight 170 lb (77.1 kg). PE not indicated    Assessment:     Contraception counseling     Plan:     Will return tomorrow for [facility] insertion- cytotec to be placed in vagina night before. And 600 motrin 30 minutes before.  RTC tomorrow.  Akon Reinoso,CNM

## 2016-12-19 ENCOUNTER — Ambulatory Visit: Payer: 59 | Admitting: Obstetrics and Gynecology

## 2016-12-20 ENCOUNTER — Ambulatory Visit (INDEPENDENT_AMBULATORY_CARE_PROVIDER_SITE_OTHER): Payer: 59 | Admitting: Obstetrics and Gynecology

## 2016-12-20 ENCOUNTER — Encounter: Payer: Self-pay | Admitting: Obstetrics and Gynecology

## 2016-12-20 VITALS — BP 118/74 | HR 93 | Wt 170.0 lb

## 2016-12-20 DIAGNOSIS — Z975 Presence of (intrauterine) contraceptive device: Secondary | ICD-10-CM

## 2016-12-20 LAB — POCT URINE PREGNANCY: PREG TEST UR: NEGATIVE

## 2016-12-20 MED ORDER — LEVONORGESTREL 13.5 MG IU IUD: 1.0000 | INTRAUTERINE_SYSTEM | Freq: Once | INTRAUTERINE | Status: DC

## 2016-12-20 NOTE — Patient Instructions (Signed)

## 2016-12-20 NOTE — Progress Notes (Signed)
Virginia Jackson is a 21 y.o. year old G46P0000 Caucasian female who presents for placement of a Skyla IUD.  No LMP recorded. Patient has had an injection. BP 118/74   Pulse 93   Wt 170 lb (77.1 kg)   BMI 30.11 kg/m  Last sexual intercourse was > 3 weeks ago, and pregnancy test today was negative  The risks and benefits of the method and placement have been thouroughly reviewed with the patient and all questions were answered.  Specifically the patient is aware of failure rate of 03/998, expulsion of the IUD and of possible perforation.  The patient is aware of irregular bleeding due to the method and understands the incidence of irregular bleeding diminishes with time.  Signed copy of informed consent in chart.   Time out was performed.  A pederson speculum was placed in the vagina.  The cervix was visualized, prepped using Betadine, and grasped with a single tooth tenaculum. The uterus was found to be neutral and it sounded to 7 cm.  Skyla IUD placed per manufacturer's recommendations.   The strings were trimmed to 3 cm.  The patient was given post procedure instructions, including signs and symptoms of infection and to check for the strings after each menses or each month, and refraining from intercourse or anything in the vagina for 3 days.  She was given a Skyla care card with date  placed, and date  to be removed.    Kylie Simmonds Suzan Nailer, CNM

## 2017-01-29 ENCOUNTER — Encounter: Payer: Self-pay | Admitting: Neurology

## 2017-01-29 ENCOUNTER — Ambulatory Visit: Payer: 59 | Admitting: Neurology

## 2017-01-29 VITALS — BP 118/82 | HR 95 | Ht 63.0 in | Wt 171.6 lb

## 2017-01-29 DIAGNOSIS — G43009 Migraine without aura, not intractable, without status migrainosus: Secondary | ICD-10-CM

## 2017-01-29 NOTE — Progress Notes (Signed)
NEUROLOGY FOLLOW UP OFFICE NOTE  Virginia RochesterKennedy A Goering [id number]  HISTORY OF PRESENT ILLNESS: Virginia Jackson Hodes is a 21 year old right-handed female with anxiety who follows up for chronic migraine.  She is accompanied by her mother who provides some history.   UPDATE: She is doing well.  She has about 1 moderate headache every other week.  She has not had any recent severe debilitating headache. Intensity:  moderate Duration:  Usually within an hour with Relpax Frequency:  2 days per month Current abortive medication:  First line:  Excedrin; Second Line:  If headache not aborted in 30 minutes, then Relpax 40mg  Anticonvulsant medications:  topiramate 50mg  Other medication:  Xanax, IUD The problem is that her insurance only allows her 4 Relpax tablets a month.  This causes anxiety for her because she never knows if she will have any available if she should have a severe migraine.  Otherwise, she will take ibuprofen, which is not effective.   Caffeine:  no Alcohol:  no Smoker:  no Diet:  Eats healthy.  Drinks water Exercise:  routine Depression/stress:  anxiety much improved. Sleep hygiene:  Usually sleeps well  HISTORY: Onset:  childhood Location:  Varies (frontal, either side) Quality:  pounding Initial Intensity:  10/10 Aura:  Sometimes sees spots in her vision prior to headache onset Prodrome:  on Associated symptoms:  Nausea, photophobia, phonophobia;  Vomiting when severe. Initial Duration:  Until she falls asleep.  Severe episodes last 1 to 3 days Initial Frequency:  Daily.  Severe episodes occur once a month to once every other month. Triggers/exacerbating factors:  movement, stress Relieving factors:  Sleep, vomiting, hot shower Activity:  Cannot function when severe (once a month to once every other month)   Past abortive medication:  Maxalt, sumatriptan po, Advil, Advil Migraine, Fioricet, Zomig 5mg  NS, Zomig 5mg  tablet Past preventative medication:  none Other past  therapy:  none   Family history of headache:  mom  PAST MEDICAL HISTORY: Past Medical History:  Diagnosis Date  . Anxiety   . Headache   . Painful menstrual periods     MEDICATIONS: Current Outpatient Medications on File Prior to Visit  Medication Sig Dispense Refill  . ALPRAZolam (XANAX) 0.5 MG tablet Take 1 tablet (0.5 mg total) by mouth 2 (two) times daily as needed for anxiety. Reported on 03/27/2015 60 tablet 2  . eletriptan (RELPAX) 40 MG tablet Take at earliest onset.  May repeat once in 2 hours if headache persists or recurs. 27 tablet 2  . ibuprofen (ADVIL,MOTRIN) 600 MG tablet Take 600 mg by mouth every 6 (six) hours as needed. Reported on 07/28/2015    . topiramate (TOPAMAX) 50 MG tablet Take 1 tablet (50 mg total) by mouth daily. 90 tablet 2   Current Facility-Administered Medications on File Prior to Visit  Medication Dose Route Frequency Provider Last Rate Last Dose  . Levonorgestrel IUD 1 Device  1 Device Intrauterine Once Shambley, Melody N, CNM        ALLERGIES: No Known Allergies  FAMILY HISTORY: Family History  Problem Relation Age of Onset  . Hypertension Maternal Grandmother   . Hypertension Maternal Grandfather   . Cancer Maternal Grandfather        lymphoma   . Cancer Paternal Grandmother        ovarian   . Aneurysm Paternal Grandfather   . Rheum arthritis Mother   . Hypertension Mother     SOCIAL HISTORY: Social History   Socioeconomic History  .  Marital status: Single    Spouse name: Not on file  . Number of children: Not on file  . Years of education: Not on file  . Highest education level: Not on file  Social Needs  . Financial resource strain: Not on file  . Food insecurity - worry: Not on file  . Food insecurity - inability: Not on file  . Transportation needs - medical: Not on file  . Transportation needs - non-medical: Not on file  Occupational History  . Not on file  Tobacco Use  . Smoking status: Never Smoker  . Smokeless  tobacco: Never Used  Substance and Sexual Activity  . Alcohol use: No    Alcohol/week: 0.0 oz  . Drug use: No  . Sexual activity: Yes    Partners: Male    Birth control/protection: Injection  Other Topics Concern  . Not on file  Social History Narrative  . Not on file    REVIEW OF SYSTEMS: Constitutional: No fevers, chills, or sweats, no generalized fatigue, change in appetite Eyes: No visual changes, double vision, eye pain Ear, nose and throat: No hearing loss, ear pain, nasal congestion, sore throat Cardiovascular: No chest pain, palpitations Respiratory:  No shortness of breath at rest or with exertion, wheezes GastrointestinaI: No nausea, vomiting, diarrhea, abdominal pain, fecal incontinence Genitourinary:  No dysuria, urinary retention or frequency Musculoskeletal:  No neck pain, back pain Integumentary: No rash, pruritus, skin lesions Neurological: as above Psychiatric: No depression, insomnia, anxiety Endocrine: No palpitations, fatigue, diaphoresis, mood swings, change in appetite, change in weight, increased thirst Hematologic/Lymphatic:  No purpura, petechiae. Allergic/Immunologic: no itchy/runny eyes, nasal congestion, recent allergic reactions, rashes  PHYSICAL EXAM: Vitals:   01/29/17 1129  BP: 118/82  Pulse: 95  SpO2: 98%   General: No acute distress.  Patient appears well-groomed.   Head:  Normocephalic/atraumatic Eyes:  Fundi examined but not visualized Neck: supple, no paraspinal tenderness, full range of motion Heart:  Regular rate and rhythm Lungs:  Clear to auscultation bilaterally Back: No paraspinal tenderness Neurological Exam: alert and oriented to person, place, and time. Attention span and concentration intact, recent and remote memory intact, fund of knowledge intact.  Speech fluent and not dysarthric, language intact.  CN II-XII intact. Bulk and tone normal, muscle strength 5/5 throughout.  Sensation to light touch  intact.  Deep tendon  reflexes 2+ throughout.  Finger to nose testing intact.  Gait normal, Romberg negative.  IMPRESSION: migraine  PLAN: 1. Continue topiramate 50mg  at bedtime 2.  For abortive therapy, continue Excedrin and Relpax 3.  Follow up in one year or as needed.  Shon MilletAdam Jesstin Studstill, DO  CC: Harlow MaresMelody Shambley, CNM

## 2017-01-29 NOTE — Patient Instructions (Signed)
1.  Continue topiramate 50mg  at bedtime 2.  When you get the migraine, use the Excedrin migraine and then Relpax as needed (or try Relpax first).  We will see if we can increase the amount of relpax per month. 3.  Follow up in one year or as needed.

## 2017-02-26 ENCOUNTER — Ambulatory Visit (INDEPENDENT_AMBULATORY_CARE_PROVIDER_SITE_OTHER): Payer: 59 | Admitting: Obstetrics and Gynecology

## 2017-02-26 ENCOUNTER — Encounter: Payer: Self-pay | Admitting: Obstetrics and Gynecology

## 2017-02-26 VITALS — BP 114/75 | HR 119 | Wt 172.5 lb

## 2017-02-26 DIAGNOSIS — M545 Low back pain, unspecified: Secondary | ICD-10-CM

## 2017-02-26 DIAGNOSIS — Z30431 Encounter for routine checking of intrauterine contraceptive device: Secondary | ICD-10-CM

## 2017-02-26 DIAGNOSIS — M5136 Other intervertebral disc degeneration, lumbar region: Secondary | ICD-10-CM

## 2017-02-26 MED ORDER — CYCLOBENZAPRINE HCL 10 MG PO TABS: 10.0000 mg | ORAL_TABLET | Freq: Three times a day (TID) | ORAL | 2 refills | Status: DC | PRN

## 2017-02-26 NOTE — Patient Instructions (Signed)
Degenerative Disk Disease Degenerative disk disease is a condition caused by the changes that occur in spinal disks as you grow older. Spinal disks are soft and compressible disks located between the bones of your spine (vertebrae). These disks act like shock absorbers. Degenerative disk disease can affect the whole spine. However, the neck and lower back are most commonly affected. Many changes can occur in the spinal disks with aging, such as:  The spinal disks may dry and shrink.  Small tears may occur in the tough, outer covering of the disk (annulus).  The disk space may become smaller due to loss of water.  Abnormal growths in the bone (spurs) may occur. This can put pressure on the nerve roots exiting the spinal canal, causing pain.  The spinal canal may become narrowed.  What increases the risk?  Being overweight.  Having a family history of degenerative disk disease.  Smoking.  There is increased risk if you are doing heavy lifting or have a sudden injury. What are the signs or symptoms? Symptoms vary from person to person and may include:  Pain that varies in intensity. Some people have no pain, while others have severe pain. The location of the pain depends on the part of your backbone that is affected. ? You will have neck or arm pain if a disk in the neck area is affected. ? You will have pain in your back, buttocks, or legs if a disk in the lower back is affected.  Pain that becomes worse while bending, reaching up, or with twisting movements.  Pain that may start gradually and then get worse as time passes. It may also start after a major or minor injury.  Numbness or tingling in the arms or legs.  How is this diagnosed? Your health care provider will ask you about your symptoms and about activities or habits that may cause the pain. He or she may also ask about any injuries, diseases, or treatments you have had. Your health care provider will examine you to check  for the range of movement that is possible in the affected area, to check for strength in your extremities, and to check for sensation in the areas of the arms and legs supplied by different nerve roots. You may also have:  An X-ray of the spine.  Other imaging tests, such as MRI.  How is this treated? Your health care provider will advise you on the best plan for treatment. Treatment may include:  Medicines.  Rehabilitation exercises.  Follow these instructions at home:  Follow proper lifting and walking techniques as advised by your health care provider.  Maintain good posture.  Exercise regularly as advised by your health care provider.  Perform relaxation exercises.  Change your sitting, standing, and sleeping habits as advised by your health care provider.  Change positions frequently.  Lose weight or maintain a healthy weight as advised by your health care provider.  Do not use any tobacco products, including cigarettes, chewing tobacco, or electronic cigarettes. If you need help quitting, ask your health care provider.  Wear supportive footwear.  Take medicines only as directed by your health care provider. Contact a health care provider if:  Your pain does not go away within 1-4 weeks.  You have significant appetite or weight loss. Get help right away if:  Your pain is severe.  You notice weakness in your arms, hands, or legs.  You begin to lose control of your bladder or bowel movements.  You have   fevers or night sweats. This information is not intended to replace advice given to you by your health care provider. Make sure you discuss any questions you have with your health care provider. Document Released: 12/23/2006 Document Revised: 08/03/2015 Document Reviewed: 06/29/2013 Elsevier Interactive Patient Education  2018 [employer] Inc[employer]  

## 2017-02-26 NOTE — Progress Notes (Signed)
Subjective:     Patient ID: Virginia Jackson, female   DOB: [birth year], 21 y.o.   MRN: [id number]  HPI Here for IUD check, stated menses was light with cramping. Also needs refill on flexeril a she saw PCP and diagnosed with degenerative disc and sciatica.   Review of Systems Negative except low back pain    Objective:   Physical Exam A&Ox4 Well groomed female in no distress Blood pressure 114/75, pulse (!) 119, weight 172 lb 8 oz (78.2 kg), last menstrual period 02/17/2017.  Body mass index is 30.56 kg/m.  Pelvic exam: normal external genitalia, vulva, vagina, cervix, uterus and adnexa, IUD string noted.    Assessment:     IUD check Low back pain    Plan:     Will continue with IUD. RTC when annual exam due Referred to Dr Celene SkeenMinder  [place] [facility],CNM

## 2017-03-07 ENCOUNTER — Other Ambulatory Visit: Payer: Self-pay | Admitting: Neurology

## 2017-03-14 ENCOUNTER — Other Ambulatory Visit: Payer: Self-pay | Admitting: *Deleted

## 2017-03-14 MED ORDER — ALPRAZOLAM 0.5 MG PO TABS: 0.5000 mg | ORAL_TABLET | Freq: Two times a day (BID) | ORAL | 2 refills | Status: DC | PRN

## 2017-06-06 ENCOUNTER — Other Ambulatory Visit: Payer: Self-pay | Admitting: Neurology

## 2017-06-06 NOTE — Telephone Encounter (Signed)
Rx sent electronically.  

## 2017-06-06 NOTE — Telephone Encounter (Signed)
*  STAT* If patient is at the pharmacy, call can be transferred to refill team.  1.     Which medications need to be refilled? (please list name of each medication and dose if know)  Topamax  2.     Which pharmacy/location (including street and city if local pharmacy) is medication to be sent to? [facility]   3.     Do they need a 30 or 90 day supply? 30

## 2017-07-07 ENCOUNTER — Encounter: Payer: Self-pay | Admitting: Neurology

## 2017-10-20 ENCOUNTER — Other Ambulatory Visit: Payer: Self-pay | Admitting: *Deleted

## 2017-10-20 MED ORDER — ALPRAZOLAM 0.5 MG PO TABS: 0.5000 mg | ORAL_TABLET | Freq: Two times a day (BID) | ORAL | 2 refills | Status: DC | PRN

## 2018-01-18 ENCOUNTER — Other Ambulatory Visit: Payer: Self-pay | Admitting: Neurology

## 2018-01-19 ENCOUNTER — Telehealth: Payer: Self-pay | Admitting: Neurology

## 2018-01-19 NOTE — Telephone Encounter (Signed)
Patient's pharmacy left a note with after hours needing a refill on the topiramate medication sent to them please. [phone] phone number. Thanks!

## 2018-01-19 NOTE — Telephone Encounter (Signed)
Rx has been sent in. 

## 2018-02-03 NOTE — Progress Notes (Signed)
NEUROLOGY FOLLOW UP OFFICE NOTE  Virginia Jackson [id number]  HISTORY OF PRESENT ILLNESS: Virginia Jackson is a 22 year old right-handed female who follows up for migraine.    UPDATE: Intensity:  Moderate Duration:  Within an hour Frequency:  Over past 30 days, about 4 days of tension type headache, no migraines Frequency of abortive medication: Has not needed to take Relpax over past month. Rescue protocol: First-line Excedrin or ibuprofen with food; second line Relpax 40 mg Current NSAIDS:  Ibuprofen. Current analgesics: Excedrin Current triptans: Relpax 40 mg Current ergotamine: None Current anti-emetic: None Current muscle relaxants: None Current anti-anxiolytic: Xanax Current sleep aide: None Current Antihypertensive medications: propranolol ER 60mg  daily (started by PCP over the summer for headaches.  She hasn't noticed a different) Current Antidepressant medications: None Current Anticonvulsant medications: Topiramate 50 mg at bedtime Current anti-CGRP: None Current Vitamins/Herbal/Supplements: Folic acid Current Antihistamines/Decongestants: None Other therapy: None Hormone/birth control: IUD  Caffeine: No Diet: Eats healthy.  Drinks plenty water. Exercise: Routine Depression: No; Anxiety: Yes Other pain: No Sleep hygiene: Good  HISTORY:  Onset: Childhood Location: Varies (frontal, either side) Quality: pounding Initial Intensity: 10/10 Aura: Sometimes sees spots in her vision prior to headache onset Prodrome: on Associated symptoms: Nausea, photophobia, phonophobia; Vomiting when severe. Initial Duration: Until she falls asleep. Severe episodes last 1 to 3 days Initial Frequency: Daily. Severe episodes occur once a month to once every other month. Triggers/exacerbating factors: movement, stress Relieving factors: Sleep, vomiting, hot shower Activity: Cannot function when severe (once a month to once every other month)  Past NSAIDS: Advil, Advil  migraine Past analgesics: Fioricet Past abortive triptans: Maxalt, sumatriptan tablet, Zomig 5 mg NS, Zomig 5 mg tablet Past abortive ergotamine:  none Past muscle relaxants:  none Past anti-emetic:  none Past antihypertensive medications:  none Past antidepressant medications:  none Past anticonvulsant medications:  none Past anti-CGRP:  none Past vitamins/Herbal/Supplements:  none Past antihistamines/decongestants:  none Other past therapies:  none  Family history of headache: mom   PAST MEDICAL HISTORY: Past Medical History:  Diagnosis Date  . Anxiety   . Degenerative disc disease, lumbar   . Headache   . Painful menstrual periods     MEDICATIONS: Current Outpatient Medications on File Prior to Visit  Medication Sig Dispense Refill  . ALPRAZolam (XANAX) 0.5 MG tablet Take 1 tablet (0.5 mg total) by mouth 2 (two) times daily as needed for anxiety. Reported on 03/27/2015 60 tablet 2  . cyclobenzaprine (FLEXERIL) 10 MG tablet Take 1 tablet (10 mg total) by mouth 3 (three) times daily as needed for muscle spasms. 30 tablet 2  . eletriptan (RELPAX) 20 MG tablet Take 20 mg by mouth as needed for migraine or headache. May repeat in 2 hours if headache persists or recurs.    Marland Kitchen eletriptan (RELPAX) 40 MG tablet Take at earliest onset.  May repeat once in 2 hours if headache persists or recurs. 27 tablet 2  . ibuprofen (ADVIL,MOTRIN) 600 MG tablet Take 600 mg by mouth every 6 (six) hours as needed. Reported on 07/28/2015    . topiramate (TOPAMAX) 50 MG tablet TAKE 1 TABLET BY MOUTH EVERY DAY 30 tablet 0   Current Facility-Administered Medications on File Prior to Visit  Medication Dose Route Frequency Provider Last Rate Last Dose  . Levonorgestrel IUD 1 Device  1 Device Intrauterine Once Shambley, Melody N, CNM        ALLERGIES: No Known Allergies  FAMILY HISTORY: Family History  Problem Relation Age of  Onset  . Hypertension Maternal Grandmother   . Hypertension Maternal  Grandfather   . Cancer Maternal Grandfather        lymphoma   . Cancer Paternal Grandmother        ovarian   . Aneurysm Paternal Grandfather   . Rheum arthritis Mother   . Hypertension Mother    SOCIAL HISTORY: Social History   Socioeconomic History  . Marital status: Single    Spouse name: Not on file  . Number of children: Not on file  . Years of education: Not on file  . Highest education level: Not on file  Occupational History  . Not on file  Social Needs  . Financial resource strain: Not on file  . Food insecurity:    Worry: Not on file    Inability: Not on file  . Transportation needs:    Medical: Not on file    Non-medical: Not on file  Tobacco Use  . Smoking status: Never Smoker  . Smokeless tobacco: Never Used  Substance and Sexual Activity  . Alcohol use: No    Alcohol/week: 0.0 standard drinks  . Drug use: No  . Sexual activity: Yes    Partners: Male    Birth control/protection: IUD    Comment: skyla  Lifestyle  . Physical activity:    Days per week: Not on file    Minutes per session: Not on file  . Stress: Not on file  Relationships  . Social connections:    Talks on phone: Not on file    Gets together: Not on file    Attends religious service: Not on file    Active member of club or organization: Not on file    Attends meetings of clubs or organizations: Not on file    Relationship status: Not on file  . Intimate partner violence:    Fear of current or ex partner: Not on file    Emotionally abused: Not on file    Physically abused: Not on file    Forced sexual activity: Not on file  Other Topics Concern  . Not on file  Social History Narrative  . Not on file    REVIEW OF SYSTEMS: Constitutional: No fevers, chills, or sweats, no generalized fatigue, change in appetite Eyes: No visual changes, double vision, eye pain Ear, nose and throat: No hearing loss, ear pain, nasal congestion, sore throat Cardiovascular: No chest pain,  palpitations Respiratory:  No shortness of breath at rest or with exertion, wheezes GastrointestinaI: No nausea, vomiting, diarrhea, abdominal pain, fecal incontinence Genitourinary:  No dysuria, urinary retention or frequency Musculoskeletal:  No neck pain, back pain Integumentary: No rash, pruritus, skin lesions Neurological: as above Psychiatric: No depression, insomnia, anxiety Endocrine: No palpitations, fatigue, diaphoresis, mood swings, change in appetite, change in weight, increased thirst Hematologic/Lymphatic:  No purpura, petechiae. Allergic/Immunologic: no itchy/runny eyes, nasal congestion, recent allergic reactions, rashes  PHYSICAL EXAM: Blood pressure 124/82, pulse 88, height 5\' 3"  (1.6 m), weight 173 lb (78.5 kg), SpO2 98 %. General: No acute distress.  Patient appears well-groomed.  Head:  Normocephalic/atraumatic Eyes:  Fundi examined but not visualized Neck: supple, no paraspinal tenderness, full range of motion Heart:  Regular rate and rhythm Lungs:  Clear to auscultation bilaterally Back: No paraspinal tenderness Neurological Exam: alert and oriented to person, place, and time. Attention span and concentration intact, recent and remote memory intact, fund of knowledge intact.  Speech fluent and not dysarthric, language intact.  CN II-XII intact. Bulk  and tone normal, muscle strength 5/5 throughout.  Sensation to light touch intact.  Deep tendon reflexes 2+ throughout, toes downgoing.  Finger to nose and heel to shin testing intact.  Gait normal, Romberg negative.  IMPRESSION: Migraine without aura, without status migrainosus, not intractable  PLAN: 1.  For preventative management, continue topiramate 50 mg at bedtime.  Since she hasn't noticed much difference with the added propranolol, we will stop for now.  If headaches get worse, we can consider increasing topiramate to 75mg  2.  For abortive therapy, Excedrin or ibuprofen and Relpax 3.  Limit use of pain  relievers to no more than 2 days out of week to prevent risk of rebound or medication-overuse headache. 4.  Keep headache diary 5.  Exercise, hydration, caffeine cessation, sleep hygiene, monitor for and avoid triggers 6.  Consider:  magnesium citrate 400mg  daily, riboflavin 400mg  daily, and coenzyme Q10 100mg  three times daily 7.  Follow up in one year or as needed.  Shon MilletAdam Jaffe, DO  CC: Virginia MaresMelody Shambley, CNM

## 2018-02-04 ENCOUNTER — Encounter: Payer: Self-pay | Admitting: Neurology

## 2018-02-04 ENCOUNTER — Ambulatory Visit: Payer: BLUE CROSS/BLUE SHIELD | Admitting: Neurology

## 2018-02-04 VITALS — BP 124/82 | HR 88 | Ht 63.0 in | Wt 173.0 lb

## 2018-02-04 DIAGNOSIS — G43009 Migraine without aura, not intractable, without status migrainosus: Secondary | ICD-10-CM | POA: Diagnosis not present

## 2018-02-04 NOTE — Patient Instructions (Signed)
1.  Continue topiramate 50mg  at bedtime.  Stop propranolol.  If headaches get worse, contact me. 2.  Continue Excedrin, ibuprofen or Relpax as needed but limit use of pain relievers to no more than 2 days out of week to prevent risk of rebound or medication-overuse headache. 3.  Keep headache diary 4.  Follow up in one year or as needed.

## 2018-02-24 ENCOUNTER — Encounter: Payer: Self-pay | Admitting: Obstetrics and Gynecology

## 2018-02-24 ENCOUNTER — Ambulatory Visit (INDEPENDENT_AMBULATORY_CARE_PROVIDER_SITE_OTHER): Payer: BLUE CROSS/BLUE SHIELD | Admitting: Obstetrics and Gynecology

## 2018-02-24 VITALS — BP 114/77 | HR 114 | Ht 63.0 in | Wt 170.0 lb

## 2018-02-24 DIAGNOSIS — Z01419 Encounter for gynecological examination (general) (routine) without abnormal findings: Secondary | ICD-10-CM

## 2018-02-24 DIAGNOSIS — Z30431 Encounter for routine checking of intrauterine contraceptive device: Secondary | ICD-10-CM

## 2018-02-24 DIAGNOSIS — J020 Streptococcal pharyngitis: Secondary | ICD-10-CM

## 2018-02-24 MED ORDER — CEFDINIR 300 MG PO CAPS: 300.0000 mg | ORAL_CAPSULE | Freq: Two times a day (BID) | ORAL | 1 refills | Status: DC

## 2018-02-24 MED ORDER — ALPRAZOLAM 0.5 MG PO TABS: 0.5000 mg | ORAL_TABLET | Freq: Two times a day (BID) | ORAL | 2 refills | Status: DC | PRN

## 2018-02-24 NOTE — Patient Instructions (Signed)
Preventive Care 18-39 Years, Female Preventive care refers to lifestyle choices and visits with your health care provider that can promote health and wellness. What does preventive care include?  A yearly physical exam. This is also called an annual well check.  Dental exams once or twice a year.  Routine eye exams. Ask your health care provider how often you should have your eyes checked.  Personal lifestyle choices, including: ? Daily care of your teeth and gums. ? Regular physical activity. ? Eating a healthy diet. ? Avoiding tobacco and drug use. ? Limiting alcohol use. ? Practicing safe sex. ? Taking vitamin and mineral supplements as recommended by your health care provider. What happens during an annual well check? The services and screenings done by your health care provider during your annual well check will depend on your age, overall health, lifestyle risk factors, and family history of disease. Counseling Your health care provider may ask you questions about your:  Alcohol use.  Tobacco use.  Drug use.  Emotional well-being.  Home and relationship well-being.  Sexual activity.  Eating habits.  Work and work Statistician.  Method of birth control.  Menstrual cycle.  Pregnancy history.  Screening You may have the following tests or measurements:  Height, weight, and BMI.  Diabetes screening. This is done by checking your blood sugar (glucose) after you have not eaten for a while (fasting).  Blood pressure.  Lipid and cholesterol levels. These may be checked every 5 years starting at age 38.  Skin check.  Hepatitis C blood test.  Hepatitis B blood test.  Sexually transmitted disease (STD) testing.  BRCA-related cancer screening. This may be done if you have a family history of breast, ovarian, tubal, or peritoneal cancers.  Pelvic exam and Pap test. This may be done every 3 years starting at age 38. Starting at age 30, this may be done  every 5 years if you have a Pap test in combination with an HPV test.  Discuss your test results, treatment options, and if necessary, the need for more tests with your health care provider. Vaccines Your health care provider may recommend certain vaccines, such as:  Influenza vaccine. This is recommended every year.  Tetanus, diphtheria, and acellular pertussis (Tdap, Td) vaccine. You may need a Td booster every 10 years.  Varicella vaccine. You may need this if you have not been vaccinated.  HPV vaccine. If you are 39 or younger, you may need three doses over 6 months.  Measles, mumps, and rubella (MMR) vaccine. You may need at least one dose of MMR. You may also need a second dose.  Pneumococcal 13-valent conjugate (PCV13) vaccine. You may need this if you have certain conditions and were not previously vaccinated.  Pneumococcal polysaccharide (PPSV23) vaccine. You may need one or two doses if you smoke cigarettes or if you have certain conditions.  Meningococcal vaccine. One dose is recommended if you are age 68-21 years and a first-year college student living in a residence hall, or if you have one of several medical conditions. You may also need additional booster doses.  Hepatitis A vaccine. You may need this if you have certain conditions or if you travel or work in places where you may be exposed to hepatitis A.  Hepatitis B vaccine. You may need this if you have certain conditions or if you travel or work in places where you may be exposed to hepatitis B.  Haemophilus influenzae type b (Hib) vaccine. You may need this  if you have certain risk factors.  Talk to your health care provider about which screenings and vaccines you need and how often you need them. This information is not intended to replace advice given to you by your health care provider. Make sure you discuss any questions you have with your health care provider. Document Released: 04/23/2001 Document Revised:  11/15/2015 Document Reviewed: 12/27/2014 Elsevier Interactive Patient Education  2018 Elsevier Inc.  

## 2018-02-24 NOTE — Progress Notes (Signed)
Subjective:     Virginia Jackson is a single white 22 y.o. female and is here for a comprehensive physical exam. FT student. The patient reports problems - woke up with sore throat this am, hurts to swallow and feels raw. .  Social History   Socioeconomic History  . Marital status: Single    Spouse name: Not on file  . Number of children: Not on file  . Years of education: Not on file  . Highest education level: Not on file  Occupational History  . Not on file  Social Needs  . Financial resource strain: Not on file  . Food insecurity:    Worry: Not on file    Inability: Not on file  . Transportation needs:    Medical: Not on file    Non-medical: Not on file  Tobacco Use  . Smoking status: Never Smoker  . Smokeless tobacco: Never Used  Substance and Sexual Activity  . Alcohol use: No    Alcohol/week: 0.0 standard drinks  . Drug use: No  . Sexual activity: Not Currently    Partners: Male    Birth control/protection: I.U.D.    Comment: skyla  Lifestyle  . Physical activity:    Days per week: Not on file    Minutes per session: Not on file  . Stress: Not on file  Relationships  . Social connections:    Talks on phone: Not on file    Gets together: Not on file    Attends religious service: Not on file    Active member of club or organization: Not on file    Attends meetings of clubs or organizations: Not on file    Relationship status: Not on file  . Intimate partner violence:    Fear of current or ex partner: Not on file    Emotionally abused: Not on file    Physically abused: Not on file    Forced sexual activity: Not on file  Other Topics Concern  . Not on file  Social History Narrative  . Not on file   Health Maintenance  Topic Date Due  . CHLAMYDIA SCREENING  02/20/2011  . HIV Screening  02/20/2011  . TETANUS/TDAP  02/20/2015  . PAP-Cervical Cytology Screening  02/19/2017  . PAP SMEAR-Modifier  02/19/2017  . INFLUENZA VACCINE  10/09/2017    The  following portions of the patient's history were reviewed and updated as appropriate: allergies, current medications, past family history, past medical history, past social history, past surgical history and problem list.  Review of Systems A comprehensive review of systems was negative.   Objective:    General appearance: alert, cooperative and appears stated age Neck: no adenopathy, no carotid bruit, no JVD, supple, symmetrical, trachea midline and thyroid not enlarged, symmetric, no tenderness/mass/nodules Lungs: clear to auscultation bilaterally Breasts: normal appearance, no masses or tenderness Heart: regular rate and rhythm, S1, S2 normal, no murmur, click, rub or gallop Abdomen: soft, non-tender; bowel sounds normal; no masses,  no organomegaly Pelvic: cervix normal in appearance, external genitalia normal, no adnexal masses or tenderness, no cervical motion tenderness, rectovaginal septum normal, uterus normal size, shape, and consistency and vagina normal without discharge   throat red with white patches scatted throughout. Assessment:    Healthy female exam. IUD check; strep throat     Plan:  omnecef 300mg  bid x 7 days sent in and xanax refilled. RTC 1 year or as needed.  Harlow MaresMelody Cylas Falzone, CNM   See After Visit Summary  for Counseling Recommendations

## 2018-02-25 ENCOUNTER — Other Ambulatory Visit (HOSPITAL_COMMUNITY)
Admission: RE | Admit: 2018-02-25 | Discharge: 2018-02-25 | Disposition: A | Discharge status: Home / Self Care | Payer: BLUE CROSS/BLUE SHIELD | Source: Ambulatory Visit | Attending: Obstetrics and Gynecology | Admitting: Obstetrics and Gynecology

## 2018-02-25 DIAGNOSIS — Z01419 Encounter for gynecological examination (general) (routine) without abnormal findings: Secondary | ICD-10-CM | POA: Diagnosis present

## 2018-02-25 NOTE — Addendum Note (Signed)
Addended by: Rosine BeatLONTZ, Elajah Kunsman L on: 02/25/2018 01:42 PM   Modules accepted: Orders

## 2018-02-26 LAB — CYTOLOGY - PAP: Diagnosis: NEGATIVE

## 2018-04-15 ENCOUNTER — Other Ambulatory Visit: Payer: Self-pay | Admitting: Neurology

## 2018-05-14 ENCOUNTER — Other Ambulatory Visit: Payer: Self-pay | Admitting: Neurology

## 2018-05-18 ENCOUNTER — Other Ambulatory Visit: Payer: Self-pay | Admitting: Neurology

## 2018-08-06 ENCOUNTER — Ambulatory Visit: Payer: BLUE CROSS/BLUE SHIELD | Admitting: Certified Nurse Midwife

## 2018-08-06 ENCOUNTER — Other Ambulatory Visit (HOSPITAL_COMMUNITY)
Admission: RE | Admit: 2018-08-06 | Discharge: 2018-08-06 | Disposition: A | Discharge status: Home / Self Care | Payer: BC Managed Care – PPO | Source: Ambulatory Visit | Attending: Certified Nurse Midwife | Admitting: Certified Nurse Midwife

## 2018-08-06 ENCOUNTER — Encounter: Payer: Self-pay | Admitting: Certified Nurse Midwife

## 2018-08-06 ENCOUNTER — Other Ambulatory Visit: Payer: Self-pay

## 2018-08-06 VITALS — BP 115/81 | HR 132 | Ht 63.0 in | Wt 164.2 lb

## 2018-08-06 DIAGNOSIS — T192XXA Foreign body in vulva and vagina, initial encounter: Secondary | ICD-10-CM | POA: Diagnosis not present

## 2018-08-06 DIAGNOSIS — N898 Other specified noninflammatory disorders of vagina: Secondary | ICD-10-CM

## 2018-08-06 DIAGNOSIS — Z113 Encounter for screening for infections with a predominantly sexual mode of transmission: Secondary | ICD-10-CM | POA: Insufficient documentation

## 2018-08-06 MED ORDER — METRONIDAZOLE 500 MG PO TABS: 500.0000 mg | ORAL_TABLET | Freq: Two times a day (BID) | ORAL | 0 refills | Status: AC

## 2018-08-06 NOTE — Progress Notes (Signed)
GYN ENCOUNTER NOTE  Subjective:       Virginia Jackson is a 23 y.o. G0P0000 female is here for gynecologic evaluation of the following issues:  1. Removal of retained tampon 2. Vaginal discharge 3. Vaginal odor  Thinks she placed two (2) tampons last Friday and "forgot to remove them both". Unable to remove at home. Reports vaginal discharge and odor for the last day.   Denies difficulty breathing or respiratory distress, fever, chest pain, abdominal pain, excessive vaginal bleeding, dysuria, and leg pain or swellling.    Gynecologic History  Patient's last menstrual period was 07/31/2018 (approximate).  Contraception: IUD, Christean GriefSkyla  Last Pap: 02/2018. Results were: normal  Obstetric History  OB History  Gravida Para Term Preterm AB Living  0 0 0 0 0 0  SAB TAB Ectopic Multiple Live Births  0 0 0 0      Past Medical History:  Diagnosis Date  . Anxiety   . Degenerative disc disease, lumbar   . Headache   . Painful menstrual periods     No past surgical history on file.  Current Outpatient Medications on File Prior to Visit  Medication Sig Dispense Refill  . ALPRAZolam (XANAX) 0.5 MG tablet Take 1 tablet (0.5 mg total) by mouth 2 (two) times daily as needed for anxiety. Reported on 03/27/2015 60 tablet 2  . cyclobenzaprine (FLEXERIL) 10 MG tablet Take 1 tablet (10 mg total) by mouth 3 (three) times daily as needed for muscle spasms. 30 tablet 2  . eletriptan (RELPAX) 40 MG tablet TAKE AT EARLIEST ONSET. MAY REPEAT ONCE IN 2 HOURS IF HEADACHE PERSISTS OR RECURS. 27 tablet 2  . fluvoxaMINE (LUVOX) 50 MG tablet Take by mouth.    . folic acid (FOLVITE) 1 MG tablet Take 1 mg by mouth daily.    Marland Kitchen. ibuprofen (ADVIL,MOTRIN) 600 MG tablet Take 600 mg by mouth every 6 (six) hours as needed. Reported on 07/28/2015    . Levonorgestrel (SKYLA) 13.5 MG IUD by Intrauterine route.    . topiramate (TOPAMAX) 50 MG tablet TAKE 1 TABLET BY MOUTH EVERY DAY 30 tablet 5  . verapamil (CALAN-SR)  180 MG CR tablet      Current Facility-Administered Medications on File Prior to Visit  Medication Dose Route Frequency Provider Last Rate Last Dose  . Levonorgestrel IUD 1 Device  1 Device Intrauterine Once HeeiaShambley, Melody N, CNM        No Known Allergies  Social History   Socioeconomic History  . Marital status: Single    Spouse name: Not on file  . Number of children: Not on file  . Years of education: Not on file  . Highest education level: Not on file  Occupational History  . Not on file  Social Needs  . Financial resource strain: Not on file  . Food insecurity:    Worry: Not on file    Inability: Not on file  . Transportation needs:    Medical: Not on file    Non-medical: Not on file  Tobacco Use  . Smoking status: Never Smoker  . Smokeless tobacco: Never Used  Substance and Sexual Activity  . Alcohol use: No    Alcohol/week: 0.0 standard drinks  . Drug use: No  . Sexual activity: Not Currently    Partners: Male    Birth control/protection: I.U.D.    Comment: skyla  Lifestyle  . Physical activity:    Days per week: Not on file    Minutes per session:  Not on file  . Stress: Not on file  Relationships  . Social connections:    Talks on phone: Not on file    Gets together: Not on file    Attends religious service: Not on file    Active member of club or organization: Not on file    Attends meetings of clubs or organizations: Not on file    Relationship status: Not on file  . Intimate partner violence:    Fear of current or ex partner: Not on file    Emotionally abused: Not on file    Physically abused: Not on file    Forced sexual activity: Not on file  Other Topics Concern  . Not on file  Social History Narrative  . Not on file    Family History  Problem Relation Age of Onset  . Hypertension Maternal Grandmother   . Hypertension Maternal Grandfather   . Cancer Maternal Grandfather        lymphoma   . Cancer Paternal Grandmother        ovarian    . Aneurysm Paternal Grandfather   . Rheum arthritis Mother   . Hypertension Mother     The following portions of the patient's history were reviewed and updated as appropriate: allergies, current medications, past family history, past medical history, past social history, past surgical history and problem list.  Review of Systems  ROS negative except as noted above. Information obtained from patient.   Objective:   BP 115/81   Pulse (!) 132   Ht 5\' 3"  (1.6 m)   Wt 164 lb 3 oz (74.5 kg)   LMP 07/31/2018 (Approximate)   BMI 29.08 kg/m    CONSTITUTIONAL: Well-developed, well-nourished female in no acute distress.   ABDOMEN: Soft, non distended; Non tender.  No Organomegaly.  PELVIC:  External Genitalia: Normal  Vagina: Two (2) tampons present  Cervix: Normal, IUD strings visible  Uterus: Normal size, shape,consistency, mobile  Adnexa: Normal  MUSCULOSKELETAL: Normal range of motion. No tenderness.  No cyanosis, clubbing, or edema.  Assessment:   1. Retained tampon, initial encounter  2. Vaginal discharge - Cervicovaginal ancillary only  3. Vaginal odor - Cervicovaginal ancillary only  4. Routine screening for STI (sexually transmitted infection) - Cervicovaginal ancillary only   Plan:   A small plastic speculum was inserted into the vagina and tampon was visualized. A ring forcep was inserted and the tampon was grasped without difficulty and placed in a glove for disposal. The second tampon was grasped without difficulty and placed in a glove for disposal. Vaginal swab was collected, see orders. Bimanual exam completed no other foreign bodies present.   Rx: Flagyl, see orders.   Reviewed red flag symptoms and when to call.   RTC as needed.    Gunnar Bulla, CNM [facility], [facility] 08/06/18 2:47 PM    Gunnar Bulla, CNM [facility], [facility] 08/06/18 2:41 PM

## 2018-08-06 NOTE — Patient Instructions (Signed)
Metronidazole tablets or capsules  What is this medicine?  METRONIDAZOLE (me troe NI da zole) is an antiinfective. It is used to treat certain kinds of bacterial and protozoal infections. It will not work for colds, flu, or other viral infections.  This medicine may be used for other purposes; ask your health care provider or pharmacist if you have questions.  COMMON BRAND NAME(S): Flagyl  What should I tell my health care provider before I take this medicine?  They need to know if you have any of these conditions:  -Cockayne syndrome  -history of blood diseases, like sickle cell anemia or leukemia  -history of yeast infection  -if you often drink alcohol  -liver disease  -an unusual or allergic reaction to metronidazole, nitroimidazoles, or other medicines, foods, dyes, or preservatives  -pregnant or trying to get pregnant  -breast-feeding  How should I use this medicine?  Take this medicine by mouth with a full Hiscox of water. Follow the directions on the prescription label. Take your medicine at regular intervals. Do not take your medicine more often than directed. Take all of your medicine as directed even if you think you are better. Do not skip doses or stop your medicine early.  Talk to your pediatrician regarding the use of this medicine in children. Special care may be needed.  Overdosage: If you think you have taken too much of this medicine contact a poison control center or emergency room at once.  NOTE: This medicine is only for you. Do not share this medicine with others.  What if I miss a dose?  If you miss a dose, take it as soon as you can. If it is almost time for your next dose, take only that dose. Do not take double or extra doses.  What may interact with this medicine?  Do not take this medicine with any of the following medications:  -alcohol or any product that contains alcohol  -cisapride  -disulfiram  -dofetilide  -dronedarone  -pimozide  -thioridazine  -ziprasidone  This medicine may also  interact with the following medications:  -amiodarone  -birth control pills  -busulfan  -carbamazepine  -cimetidine  -cyclosporine  -fluorouracil  -lithium  -other medicines that prolong the QT interval (cause an abnormal heart rhythm)  -phenobarbital  -phenytoin  -quinidine  -tacrolimus  -vecuronium  -warfarin  This list may not describe all possible interactions. Give your health care provider a list of all the medicines, herbs, non-prescription drugs, or dietary supplements you use. Also tell them if you smoke, drink alcohol, or use illegal drugs. Some items may interact with your medicine.  What should I watch for while using this medicine?  Tell your doctor or health care professional if your symptoms do not improve or if they get worse.  You may get drowsy or dizzy. Do not drive, use machinery, or do anything that needs mental alertness until you know how this medicine affects you. Do not stand or sit up quickly, especially if you are an older patient. This reduces the risk of dizzy or fainting spells.  Ask your doctor or health care professional if you should avoid alcohol. Many nonprescription cough and cold products contain alcohol. Metronidazole can cause an unpleasant reaction when taken with alcohol. The reaction includes flushing, headache, nausea, vomiting, sweating, and increased thirst. The reaction can last from 30 minutes to several hours.  If you are being treated for a sexually transmitted disease, avoid sexual contact until you have finished your treatment. Your   sore throat -fever with rash, swollen lymph nodes, or swelling of the face -pain, tingling,  numbness in the hands or feet -redness, blistering, peeling or loosening of the skin, including inside the mouth -seizures -sign and symptoms of liver injury like dark yellow or brown urine; general ill feeling or flu-like symptoms; light colored stools; loss of appetite; nausea; right upper belly pain; unusually weak or tired; yellowing of the eyes or skin -vaginal discharge, itching, or odor in women Side effects that usually do not require medical attention (report to your doctor or health care professional if they continue or are bothersome): -changes in taste -diarrhea -headache -nausea, vomiting -stomach pain This list may not describe all possible side effects. Call your doctor for medical advice about side effects. You may report side effects to FDA at 1-800-FDA-1088. Where should I keep my medicine? Keep out of the reach of children. Store at room temperature below 25 degrees C (77 degrees F). Protect from light. Keep container tightly closed. Throw away any unused medicine after the expiration date. NOTE: This sheet is a summary. It may not cover all possible information. If you have questions about this medicine, talk to your doctor, pharmacist, or health care provider.  2019 Elsevier/Gold Standard (2016-05-29 20:55:23) Bacterial Vaginosis  Bacterial vaginosis is an infection of the vagina. It happens when too many normal germs (healthy bacteria) grow in the vagina. This infection puts you at risk for infections from sex (STIs). Treating this infection can lower your risk for some STIs. You should also treat this if you are pregnant. It can cause your baby to be born early. Follow these instructions at home: Medicines  Take over-the-counter and prescription medicines only as told by your doctor.  Take or use your antibiotic medicine as told by your doctor. Do not stop taking or using it even if you start to feel better. General instructions  If you your sexual partner is a  woman, tell her that you have this infection. She needs to get treatment if she has symptoms. If you have a female partner, he does not need to be treated.  During treatment: ? Avoid sex. ? Do not douche. ? Avoid alcohol as told. ? Avoid breastfeeding as told.  Drink enough fluid to keep your pee (urine) clear or pale yellow.  Keep your vagina and butt (rectum) clean. ? Wash the area with warm water every day. ? Wipe from front to back after you use the toilet.  Keep all follow-up visits as told by your doctor. This is important. Preventing this condition  Do not douche.  Use only warm water to wash around your vagina.  Use protection when you have sex. This includes: ? Latex condoms. ? Dental dams.  Limit how many people you have sex with. It is best to only have sex with the same person (be monogamous).  Get tested for STIs. Have your partner get tested.  Wear underwear that is cotton or lined with cotton.  Avoid tight pants and pantyhose. This is most important in summer.  Do not use any products that have nicotine or tobacco in them. These include cigarettes and e-cigarettes. If you need help quitting, ask your doctor.  Do not use illegal drugs.  Limit how much alcohol you drink. Contact a doctor if:  Your symptoms do not get better, even after you are treated.  You have more discharge or pain when you pee (urinate).  You have a fever.  You have pain in your  belly (abdomen).  You have pain with sex.  Your bleed from your vagina between periods. Summary  This infection happens when too many germs (bacteria) grow in the vagina.  Treating this condition can lower your risk for some infections from sex (STIs).  You should also treat this if you are pregnant. It can cause early (premature) birth.  Do not stop taking or using your antibiotic medicine even if you start to feel better. This information is not intended to replace advice given to you by your health  care provider. Make sure you discuss any questions you have with your health care provider. Document Released: 12/05/2007 Document Revised: 11/11/2015 Document Reviewed: 11/11/2015 Elsevier Interactive Patient Education  2019 [employer].

## 2018-08-11 LAB — CERVICOVAGINAL ANCILLARY ONLY
Bacterial vaginitis: NEGATIVE
Candida vaginitis: NEGATIVE
Chlamydia: NEGATIVE
Neisseria Gonorrhea: NEGATIVE
Trichomonas: NEGATIVE

## 2018-10-30 ENCOUNTER — Other Ambulatory Visit: Payer: Self-pay

## 2018-10-30 DIAGNOSIS — Z021 Encounter for pre-employment examination: Secondary | ICD-10-CM

## 2018-12-04 ENCOUNTER — Other Ambulatory Visit: Payer: Self-pay | Admitting: Neurology

## 2018-12-23 ENCOUNTER — Ambulatory Visit: Payer: BLUE CROSS/BLUE SHIELD | Admitting: Obstetrics and Gynecology

## 2019-02-03 ENCOUNTER — Encounter: Payer: Self-pay | Admitting: Neurology

## 2019-02-03 ENCOUNTER — Other Ambulatory Visit: Payer: Self-pay

## 2019-02-03 ENCOUNTER — Telehealth (INDEPENDENT_AMBULATORY_CARE_PROVIDER_SITE_OTHER): Payer: BC Managed Care – PPO | Admitting: Neurology

## 2019-02-03 VITALS — Ht 62.0 in | Wt 165.0 lb

## 2019-02-03 DIAGNOSIS — G43009 Migraine without aura, not intractable, without status migrainosus: Secondary | ICD-10-CM

## 2019-02-03 NOTE — Progress Notes (Signed)
Virtual Visit via Video Note The purpose of this virtual visit is to provide medical care while limiting exposure to the novel coronavirus.    Consent was obtained for video visit:  Yes Answered questions that patient had about telehealth interaction:  Yes I discussed the limitations, risks, security and privacy concerns of performing an evaluation and management service by telemedicine. I also discussed with the patient that there may be a patient responsible charge related to this service. The patient expressed understanding and agreed to proceed.  Pt location: Home Physician Location: Home Name of referring provider:  Idelle Crouch, MD I connected with Virginia Jackson at patients initiation/request on 02/03/2019 at 10:10 AM EST by video enabled telemedicine application and verified that I am speaking with the correct person using two identifiers. Pt MRN:  [id number] Pt DOB:  [birth year] Video Participants:  Virginia Jackson;   History of Present Illness:  Virginia Jackson is a 23 year old right-handed female who follows up for migraine.    UPDATE: She is doing well.  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  Since last visit, her PCP started on verapamil over the summer.  No change. Intensity:  Moderate to severe Duration:  Within an hour Frequency:  3 a month (one is very severe) Frequency of abortive medication: Has not needed to take Relpax over past month. Rescue protocol: First-line Excedrin or ibuprofen with food; second line Relpax 40 mg Current NSAIDS:  Ibuprofen. Current analgesics: Excedrin Current triptans: Relpax 40 mg Current ergotamine: None Current anti-emetic: None Current muscle relaxants: None Current anti-anxiolytic: Xanax Current sleep aide: None Current Antihypertensive medications: verapamil CR 180mg  Current Antidepressant medications: None Current Anticonvulsant medications: Topiramate 50 mg at bedtime Current  anti-CGRP: None Current Vitamins/Herbal/Supplements: Folic acid Current Antihistamines/Decongestants: None Other therapy: None Hormone/birth control: IUD  Caffeine: No Diet: Eats healthy.  Drinks plenty water. Exercise: Routine Depression: No; Anxiety: Yes Other pain: No Sleep hygiene: Good  HISTORY: Onset:  Childhood Location:Varies (frontal, either side) Quality:pounding Initial Intensity:10/10 Aura:Sometimes sees spots in her vision prior to headache onset Prodrome:on Associated symptoms: Nausea, photophobia, phonophobia;Vomiting when severe. Initial Duration:Until she falls asleep.Severe episodes last 1 to 3 days Initial Frequency:Daily.Severe episodes occur once a month to once every other month. Triggers/aggravating factors:  movement, stress Relieving factors:Sleep, vomiting, hot shower Activity:Cannot function when severe (once a month to once every other month)  Past NSAIDS: Advil, Advil migraine Past analgesics: Fioricet Past abortive triptans: Maxalt, sumatriptan tablet, Zomig 5 mg NS, Zomig 5 mg tablet Past abortive ergotamine:  none Past muscle relaxants:  none Past anti-emetic:  none Past antihypertensive medications: propranolol ER 60mg  Past antidepressant medications:  none Past anticonvulsant medications:  none Past anti-CGRP:  none Past vitamins/Herbal/Supplements:  none Past antihistamines/decongestants:  none Other past therapies:  none  Family history of headache:mom  Past Medical History: Past Medical History:  Diagnosis Date  . Anxiety   . Degenerative disc disease, lumbar   . Headache   . Painful menstrual periods     Medications: Outpatient Encounter Medications as of 02/03/2019  Medication Sig  . ALPRAZolam (XANAX) 0.5 MG tablet Take 1 tablet (0.5 mg total) by mouth 2 (two) times daily as needed for anxiety. Reported on 03/27/2015  . cyclobenzaprine (FLEXERIL) 10 MG tablet Take 1 tablet (10 mg total) by  mouth 3 (three) times daily as needed for muscle spasms.  Marland Kitchen eletriptan (RELPAX) 40 MG tablet TAKE AT EARLIEST ONSET. MAY REPEAT ONCE IN  2 HOURS IF HEADACHE PERSISTS OR RECURS.  . folic acid (FOLVITE) 1 MG tablet Take 1 mg by mouth daily.  Marland Kitchen. ibuprofen (ADVIL,MOTRIN) 600 MG tablet Take 600 mg by mouth every 6 (six) hours as needed. Reported on 07/28/2015  . Levonorgestrel (SKYLA) 13.5 MG IUD by Intrauterine route.  . topiramate (TOPAMAX) 50 MG tablet TAKE 1 TABLET BY MOUTH EVERY DAY  . verapamil (CALAN-SR) 180 MG CR tablet   . fluvoxaMINE (LUVOX) 50 MG tablet Take by mouth.   Facility-Administered Encounter Medications as of 02/03/2019  Medication  . Levonorgestrel IUD 1 Device    Allergies: No Known Allergies  Family History: Family History  Problem Relation Age of Onset  . Hypertension Maternal Grandmother   . Hypertension Maternal Grandfather   . Cancer Maternal Grandfather        lymphoma   . Cancer Paternal Grandmother        ovarian   . Aneurysm Paternal Grandfather   . Rheum arthritis Mother   . Hypertension Mother     Social History: Social History   Socioeconomic History  . Marital status: Single    Spouse name: Not on file  . Number of children: 0  . Years of education: Not on file  . Highest education level: Bachelor's degree (e.g., BA, AB, BS)  Occupational History  . Not on file  Social Needs  . Financial resource strain: Not on file  . Food insecurity    Worry: Not on file    Inability: Not on file  . Transportation needs    Medical: Not on file    Non-medical: Not on file  Tobacco Use  . Smoking status: Never Smoker  . Smokeless tobacco: Never Used  Substance and Sexual Activity  . Alcohol use: No    Alcohol/week: 0.0 standard drinks  . Drug use: No  . Sexual activity: Not Currently    Partners: Male    Birth control/protection: I.U.D.    Comment: skyla  Lifestyle  . Physical activity    Days per week: Not on file    Minutes per session: Not  on file  . Stress: Not on file  Relationships  . Social Musicianconnections    Talks on phone: Not on file    Gets together: Not on file    Attends religious service: Not on file    Active member of club or organization: Not on file    Attends meetings of clubs or organizations: Not on file    Relationship status: Not on file  . Intimate partner violence    Fear of current or ex partner: Not on file    Emotionally abused: Not on file    Physically abused: Not on file    Forced sexual activity: Not on file  Other Topics Concern  . Not on file  Social History Narrative   Pt is single lives with family   No children   Right handed   Drinks no coffee, tea nor soda, mostly water   exercise 3 times a week    Observations/Objective:   Height 5\' 2"  (1.575 m), weight 165 lb (74.8 kg). No acute distress.  Alert and oriented.  Speech fluent and not dysarthric.  Language intact.  Eyes orthophoric on primary gaze.  Face symmetric.  Assessment and Plan:   Migraine without aura, without status migrainosus, not intractable.  Migraines have been well controlled.  They are infrequent and easily aborted with current pain relievers.  I don't think the verapamil  is needed at this time, so she will discontinue it.  1.  For preventative management, topiramate 50mg  at bedtime 2.  For abortive therapy, Excedrin, ibuprofen or Relpax 40mg  3.  Limit use of pain relievers to no more than 2 days out of week to prevent risk of rebound or medication-overuse headache. 4.  Keep headache diary 5.  Exercise, hydration, caffeine cessation, sleep hygiene, monitor for and avoid triggers 6.  Consider:  magnesium citrate 400mg  daily, riboflavin 400mg  daily, and coenzyme Q10 100mg  three times daily 7. Always keep in mind that currently taking a hormone or birth control may be a possible trigger or aggravating factor for migraine. 8. Follow up one year   Follow Up Instructions:    -I discussed the assessment and treatment  plan with the patient. The patient was provided an opportunity to ask questions and all were answered. The patient agreed with the plan and demonstrated an understanding of the instructions.   The patient was advised to call back or seek an in-person evaluation if the symptoms worsen or if the condition fails to improve as anticipated.   , DO

## 2019-02-15 ENCOUNTER — Other Ambulatory Visit (HOSPITAL_COMMUNITY)
Admission: RE | Admit: 2019-02-15 | Discharge: 2019-02-15 | Disposition: A | Discharge status: Home / Self Care | Payer: BC Managed Care – PPO | Source: Ambulatory Visit | Attending: Certified Nurse Midwife | Admitting: Certified Nurse Midwife

## 2019-02-15 ENCOUNTER — Ambulatory Visit (INDEPENDENT_AMBULATORY_CARE_PROVIDER_SITE_OTHER): Payer: BC Managed Care – PPO | Admitting: Certified Nurse Midwife

## 2019-02-15 ENCOUNTER — Encounter: Payer: Self-pay | Admitting: Certified Nurse Midwife

## 2019-02-15 ENCOUNTER — Other Ambulatory Visit: Payer: Self-pay

## 2019-02-15 VITALS — BP 122/84 | HR 109 | Ht 62.0 in | Wt 175.4 lb

## 2019-02-15 DIAGNOSIS — Z202 Contact with and (suspected) exposure to infections with a predominantly sexual mode of transmission: Secondary | ICD-10-CM | POA: Insufficient documentation

## 2019-02-15 DIAGNOSIS — Z01419 Encounter for gynecological examination (general) (routine) without abnormal findings: Secondary | ICD-10-CM

## 2019-02-15 NOTE — Patient Instructions (Signed)
Preventive Care 21-23 Years Old, Female Preventive care refers to visits with your health care provider and lifestyle choices that can promote health and wellness. This includes:  A yearly physical exam. This may also be called an annual well check.  Regular dental visits and eye exams.  Immunizations.  Screening for certain conditions.  Healthy lifestyle choices, such as eating a healthy diet, getting regular exercise, not using drugs or products that contain nicotine and tobacco, and limiting alcohol use. What can I expect for my preventive care visit? Physical exam Your health care provider will check your:  Height and weight. This may be used to calculate body mass index (BMI), which tells if you are at a healthy weight.  Heart rate and blood pressure.  Skin for abnormal spots. Counseling Your health care provider may ask you questions about your:  Alcohol, tobacco, and drug use.  Emotional well-being.  Home and relationship well-being.  Sexual activity.  Eating habits.  Work and work environment.  Method of birth control.  Menstrual cycle.  Pregnancy history. What immunizations do I need?  Influenza (flu) vaccine  This is recommended every year. Tetanus, diphtheria, and pertussis (Tdap) vaccine  You may need a Td booster every 10 years. Varicella (chickenpox) vaccine  You may need this if you have not been vaccinated. Human papillomavirus (HPV) vaccine  If recommended by your health care provider, you may need three doses over 6 months. Measles, mumps, and rubella (MMR) vaccine  You may need at least one dose of MMR. You may also need a second dose. Meningococcal conjugate (MenACWY) vaccine  One dose is recommended if you are age 19-21 years and a first-year college student living in a residence hall, or if you have one of several medical conditions. You may also need additional booster doses. Pneumococcal conjugate (PCV13) vaccine  You may need  this if you have certain conditions and were not previously vaccinated. Pneumococcal polysaccharide (PPSV23) vaccine  You may need one or two doses if you smoke cigarettes or if you have certain conditions. Hepatitis A vaccine  You may need this if you have certain conditions or if you travel or work in places where you may be exposed to hepatitis A. Hepatitis B vaccine  You may need this if you have certain conditions or if you travel or work in places where you may be exposed to hepatitis B. Haemophilus influenzae type b (Hib) vaccine  You may need this if you have certain conditions. You may receive vaccines as individual doses or as more than one vaccine together in one shot (combination vaccines). Talk with your health care provider about the risks and benefits of combination vaccines. What tests do I need?  Blood tests  Lipid and cholesterol levels. These may be checked every 5 years starting at age 20.  Hepatitis C test.  Hepatitis B test. Screening  Diabetes screening. This is done by checking your blood sugar (glucose) after you have not eaten for a while (fasting).  Sexually transmitted disease (STD) testing.  BRCA-related cancer screening. This may be done if you have a family history of breast, ovarian, tubal, or peritoneal cancers.  Pelvic exam and Pap test. This may be done every 3 years starting at age 21. Starting at age 30, this may be done every 5 years if you have a Pap test in combination with an HPV test. Talk with your health care provider about your test results, treatment options, and if necessary, the need for more tests.   Follow these instructions at home: Eating and drinking   Eat a diet that includes fresh fruits and vegetables, whole grains, lean protein, and low-fat dairy.  Take vitamin and mineral supplements as recommended by your health care provider.  Do not drink alcohol if: ? Your health care provider tells you not to drink. ? You are  pregnant, may be pregnant, or are planning to become pregnant.  If you drink alcohol: ? Limit how much you have to 0-1 drink a day. ? Be aware of how much alcohol is in your drink. In the [place] one drink equals one 12 oz bottle of beer (355 mL), one 5 oz Askren of wine (148 mL), or one 1 oz Bangs of hard liquor (44 mL). Lifestyle  Take daily care of your teeth and gums.  Stay active. Exercise for at least 30 minutes on 5 or more days each week.  Do not use any products that contain nicotine or tobacco, such as cigarettes, e-cigarettes, and chewing tobacco. If you need help quitting, ask your health care provider.  If you are sexually active, practice safe sex. Use a condom or other form of birth control (contraception) in order to prevent pregnancy and STIs (sexually transmitted infections). If you plan to become pregnant, see your health care provider for a preconception visit. What's next?  Visit your health care provider once a year for a well check visit.  Ask your health care provider how often you should have your eyes and teeth checked.  Stay up to date on all vaccines. This information is not intended to replace advice given to you by your health care provider. Make sure you discuss any questions you have with your health care provider. Document Released: 04/23/2001 Document Revised: 11/06/2017 Document Reviewed: 11/06/2017 Elsevier Patient Education  2020 [employer]  

## 2019-02-15 NOTE — Progress Notes (Signed)
GYNECOLOGY ANNUAL PREVENTATIVE CARE ENCOUNTER NOTE  History:     Virginia Jackson is a 23 y.o. G0P0000 female here for a routine annual gynecologic exam.  Current complaints: none.   Denies abnormal vaginal bleeding, discharge, pelvic pain, problems with intercourse or other gynecologic concerns.     PCP- Dr. Judithann Sheen Student FT/works a golf course Sexually active -one female partner No children Exercise daily q 2hrs ( treadmill/weights)  Gynecologic History Patient's last menstrual period was 02/01/2019 (exact date). Contraception: IUD Last Pap: 02/25/18. Results were: normal  Last mammogram: n/a   Obstetric History OB History  Gravida Para Term Preterm AB Living  0 0 0 0 0 0  SAB TAB Ectopic Multiple Live Births  0 0 0 0      Past Medical History:  Diagnosis Date  . Anxiety   . Degenerative disc disease, lumbar   . Headache   . Painful menstrual periods     No past surgical history on file.  Current Outpatient Medications on File Prior to Visit  Medication Sig Dispense Refill  . ALPRAZolam (XANAX) 0.5 MG tablet Take 1 tablet (0.5 mg total) by mouth 2 (two) times daily as needed for anxiety. Reported on 03/27/2015 60 tablet 2  . eletriptan (RELPAX) 40 MG tablet TAKE AT EARLIEST ONSET. MAY REPEAT ONCE IN 2 HOURS IF HEADACHE PERSISTS OR RECURS. 27 tablet 2  . folic acid (FOLVITE) 1 MG tablet Take 1 mg by mouth daily.    Marland Kitchen ibuprofen (ADVIL,MOTRIN) 600 MG tablet Take 600 mg by mouth every 6 (six) hours as needed. Reported on 07/28/2015    . Levonorgestrel (SKYLA) 13.5 MG IUD by Intrauterine route.    Marland Kitchen PARoxetine (PAXIL) 20 MG tablet Take 20 mg by mouth daily.    . QUEtiapine (SEROQUEL) 50 MG tablet Take 50 mg by mouth at bedtime.    . topiramate (TOPAMAX) 50 MG tablet TAKE 1 TABLET BY MOUTH EVERY DAY 30 tablet 5  . verapamil (CALAN-SR) 180 MG CR tablet     . cyclobenzaprine (FLEXERIL) 10 MG tablet Take 1 tablet (10 mg total) by mouth 3 (three) times daily as needed for muscle  spasms. (Patient not taking: Reported on 02/15/2019) 30 tablet 2  . fluvoxaMINE (LUVOX) 50 MG tablet Take by mouth.     Current Facility-Administered Medications on File Prior to Visit  Medication Dose Route Frequency Provider Last Rate Last Dose  . Levonorgestrel IUD 1 Device  1 Device Intrauterine Once [place], Melody N, CNM        No Known Allergies  Social History:  reports that she has never smoked. She has never used smokeless tobacco. She reports that she does not drink alcohol or use drugs.  Family History  Problem Relation Age of Onset  . Hypertension Maternal Grandmother   . Hypertension Maternal Grandfather   . Cancer Maternal Grandfather        lymphoma   . Cancer Paternal Grandmother        ovarian   . Aneurysm Paternal Grandfather   . Rheum arthritis Mother   . Hypertension Mother     The following portions of the patient's history were reviewed and updated as appropriate: allergies, current medications, past family history, past medical history, past social history, past surgical history and problem list.  Review of Systems Pertinent items noted in HPI and remainder of comprehensive ROS otherwise negative.  Physical Exam:  BP 122/84   Pulse (!) 109   Ht 5\' 2"  (1.575 m)  Wt 175 lb 7 oz (79.6 kg)   LMP 02/01/2019 (Exact Date)   BMI 32.09 kg/m  CONSTITUTIONAL: Well-developed, well-nourished female in no acute distress.  HENT:  Normocephalic, atraumatic, External right and left ear normal. Oropharynx is clear and moist EYES: Conjunctivae and EOM are normal. Pupils are equal, round, and reactive to light. No scleral icterus.  NECK: Normal range of motion, supple, no masses.  Normal thyroid.  SKIN: Skin is warm and dry. No rash noted. Not diaphoretic. No erythema. No pallor. MUSCULOSKELETAL: Normal range of motion. No tenderness.  No cyanosis, clubbing, or edema.  2+ distal pulses. NEUROLOGIC: Alert and oriented to person, place, and time. Normal reflexes, muscle  tone coordination.  PSYCHIATRIC: Normal mood and affect. Normal behavior. Normal judgment and thought content. CARDIOVASCULAR: Normal heart rate noted, regular rhythm RESPIRATORY: Clear to auscultation bilaterally. Effort and breath sounds normal, no problems with respiration noted. BREASTS: Symmetric in size. No masses, tenderness, skin changes, nipple drainage, or lymphadenopathy bilaterally. ABDOMEN: Soft, no distention noted.  No tenderness, rebound or guarding.  PELVIC: Normal appearing external genitalia and urethral meatus; normal appearing vaginal mucosa and cervix.  No abnormal discharge noted.    Normal uterine size, no other palpable masses, no uterine or adnexal tenderness.   Assessment and Plan:  Annual [facility] Women GYN Exam  Pap smear not indicated. Mammogram not indicated Labs: STD testing per pt request Refills: none Routine preventative health maintenance measures emphasized. Please refer to After Visit Summary for other counseling recommendations.      Philip Aspen, CNM

## 2019-02-17 LAB — HIV ANTIBODY (ROUTINE TESTING W REFLEX): HIV Screen 4th Generation wRfx: NONREACTIVE

## 2019-02-17 LAB — RPR: RPR Ser Ql: NONREACTIVE

## 2019-02-17 LAB — CERVICOVAGINAL ANCILLARY ONLY
Bacterial Vaginitis (gardnerella): NEGATIVE
Candida Glabrata: NEGATIVE
Candida Vaginitis: NEGATIVE
Chlamydia: NEGATIVE
Comment: NEGATIVE
Comment: NEGATIVE
Comment: NEGATIVE
Comment: NEGATIVE
Comment: NEGATIVE
Comment: NORMAL
Neisseria Gonorrhea: NEGATIVE
Trichomonas: NEGATIVE

## 2019-02-17 LAB — HSV 1 AND 2 IGM ABS, INDIRECT
HSV 1 IgM: <1:10 {titer}
HSV 2 IgM: <1:10 {titer}

## 2019-02-17 LAB — HEPATITIS B SURFACE ANTIGEN: Hepatitis B Surface Ag: NEGATIVE

## 2019-02-26 ENCOUNTER — Encounter: Payer: BLUE CROSS/BLUE SHIELD | Admitting: Obstetrics and Gynecology

## 2019-06-08 ENCOUNTER — Other Ambulatory Visit: Payer: Self-pay | Admitting: Neurology

## 2019-06-30 ENCOUNTER — Ambulatory Visit: Payer: Self-pay | Admitting: Physician Assistant

## 2019-06-30 ENCOUNTER — Other Ambulatory Visit: Payer: Self-pay

## 2019-06-30 DIAGNOSIS — Z113 Encounter for screening for infections with a predominantly sexual mode of transmission: Secondary | ICD-10-CM

## 2019-06-30 LAB — WET PREP FOR TRICH, YEAST, CLUE
Trichomonas Exam: NEGATIVE
Yeast Exam: NEGATIVE

## 2019-07-01 ENCOUNTER — Encounter: Payer: Self-pay | Admitting: Physician Assistant

## 2019-07-01 NOTE — Progress Notes (Signed)
[facility] Department STI clinic/screening visit  Subjective:  Virginia Jackson is a 24 y.o. female being seen today for an STI screening visit. The patient reports they do not have symptoms.  Patient reports that they do not desire a pregnancy in the next year.   They reported they are not interested in discussing contraception today.  No LMP recorded. (Menstrual status: IUD).   Patient has the following medical conditions:   Patient Active Problem List   Diagnosis Date Noted  . Secondary amenorrhea 10/25/2015  . Family history of ovarian cancer 10/25/2015  . Acne 01/04/2015  . Migraine without aura and without status migrainosus, not intractable 12/14/2014    Chief Complaint  Patient presents with  . SEXUALLY TRANSMITTED DISEASE    HPI  Patient reports that she does not have any symptoms but would like a screening today.  Reports history of migraine headaches and takes regular medications per medication list.  Has Skyla as BCM and does not have regular periods with the IUD in place.  Declines blood work today.  See flowsheet for further details and programmatic requirements.    The following portions of the patient's history were reviewed and updated as appropriate: allergies, current medications, past medical history, past social history, past surgical history and problem list.  Objective:  There were no vitals filed for this visit.  Physical Exam Constitutional:      General: She is not in acute distress.    Appearance: Normal appearance.  HENT:     Head: Normocephalic and atraumatic.     Comments: No nits, lice, or hair loss. No cervical, supraclavicular or axillary adenopathy.    Mouth/Throat:     Mouth: Mucous membranes are moist.     Pharynx: Oropharynx is clear. No oropharyngeal exudate or posterior oropharyngeal erythema.  Eyes:     Conjunctiva/sclera: Conjunctivae normal.  Pulmonary:     Effort: Pulmonary effort is normal.  Abdominal:   Palpations: Abdomen is soft. There is no mass.     Tenderness: There is no abdominal tenderness. There is no guarding or rebound.  Genitourinary:    General: Normal vulva.     Rectum: Normal.     Comments: External genitalia/pubic area without nits, lice, edema, erythema, lesions and inguinal adenopathy. Vagina with normal mucosa and discharge, Cervix without visible lesions, IUD strings visualized and palpated. Uterus firm, mobile, nt, no masses, no CMT, no adnexal tenderness or fullness. Musculoskeletal:     Cervical back: Neck supple. No tenderness.  Skin:    General: Skin is warm and dry.     Findings: No bruising, erythema, lesion or rash.     Comments: All over tan and multiple tattoos.  Neurological:     Mental Status: She is alert and oriented to person, place, and time.  Psychiatric:        Mood and Affect: Mood normal.        Behavior: Behavior normal.        Thought Content: Thought content normal.        Judgment: Judgment normal.      Assessment and Plan:  Virginia Jackson is a 24 y.o. female presenting to the [facility] for STI screening  1. Screening for STD (sexually transmitted disease) Patient into clinic without symptoms.  Declines blood work today. Rec condoms with all sex. Await test results.  Counseled that RN will call if needs to RTC for treatment once results are back. - WET PREP FOR TRICH,  YEAST, CLUE - Gonococcus culture - Chlamydia/Gonorrhea [facility]     No follow-ups on file.  Future Appointments  Date Time Provider [place]  12/16/2019  3:45 PM Diona Fanti, CNM [facility] None  02/07/2020  8:50 AM Pieter Partridge, DO [facility] None  02/17/2020  8:45 AM [place], Lara Mulch, CNM [facility] None    Jerene Dilling, [place]

## 2019-07-05 LAB — GONOCOCCUS CULTURE

## 2019-12-16 ENCOUNTER — Ambulatory Visit: Payer: BC Managed Care – PPO | Admitting: Certified Nurse Midwife

## 2019-12-27 ENCOUNTER — Encounter: Payer: Self-pay | Admitting: Certified Nurse Midwife

## 2019-12-27 ENCOUNTER — Other Ambulatory Visit: Payer: Self-pay

## 2019-12-27 ENCOUNTER — Ambulatory Visit: Payer: BC Managed Care – PPO | Admitting: Certified Nurse Midwife

## 2019-12-27 VITALS — BP 126/80 | HR 139 | Ht 62.0 in | Wt 182.5 lb

## 2019-12-27 DIAGNOSIS — Z30430 Encounter for insertion of intrauterine contraceptive device: Secondary | ICD-10-CM

## 2019-12-27 NOTE — Patient Instructions (Addendum)
IUD PLACEMENT POST-PROCEDURE INSTRUCTIONS  1. You may take Ibuprofen, Aleve or Tylenol for pain if needed.  Cramping should resolve within in 24 hours.  2. You may have a small amount of spotting.  You should wear a mini pad for the next few days.  3. You may have intercourse after 72 hours.  If you using this for birth control, it is effective immediately.  4. You need to call if you have any pelvic pain, fever, heavy bleeding or foul smelling vaginal discharge.  Irregular bleeding is common the first several months after having an IUD placed. You do not need to call for this reason unless you are concerned.  5. Shower or bathe as normal  6. You should have a follow-up appointment in 4-8 weeks for a re-check to make sure you are not having any problems.   Levonorgestrel intrauterine device (IUD) What is this medicine? LEVONORGESTREL IUD (LEE voe nor jes trel) is a contraceptive (birth control) device. The device is placed inside the uterus by a healthcare professional. It is used to prevent pregnancy. This device can also be used to treat heavy bleeding that occurs during your period. This medicine may be used for other purposes; ask your health care provider or pharmacist if you have questions. COMMON BRAND NAME(S): Kyleena, LILETTA, Mirena, Skyla What should I tell my health care provider before I take this medicine? They need to know if you have any of these conditions:  abnormal Pap smear  cancer of the breast, uterus, or cervix  diabetes  endometritis  genital or pelvic infection now or in the past  have more than one sexual partner or your partner has more than one partner  heart disease  history of an ectopic or tubal pregnancy  immune system problems  IUD in place  liver disease or tumor  problems with blood clots or take blood-thinners  seizures  use intravenous drugs  uterus of unusual shape  vaginal bleeding that has not been explained  an unusual or  allergic reaction to levonorgestrel, other hormones, silicone, or polyethylene, medicines, foods, dyes, or preservatives  pregnant or trying to get pregnant  breast-feeding How should I use this medicine? This device is placed inside the uterus by a health care professional. Talk to your pediatrician regarding the use of this medicine in children. Special care may be needed. Overdosage: If you think you have taken too much of this medicine contact a poison control center or emergency room at once. NOTE: This medicine is only for you. Do not share this medicine with others. What if I miss a dose? This does not apply. Depending on the brand of device you have inserted, the device will need to be replaced every 3 to 6 years if you wish to continue using this type of birth control. What may interact with this medicine? Do not take this medicine with any of the following medications:  amprenavir  bosentan  fosamprenavir This medicine may also interact with the following medications:  aprepitant  armodafinil  barbiturate medicines for inducing sleep or treating seizures  bexarotene  boceprevir  griseofulvin  medicines to treat seizures like carbamazepine, ethotoin, felbamate, oxcarbazepine, phenytoin, topiramate  modafinil  pioglitazone  rifabutin  rifampin  rifapentine  some medicines to treat HIV infection like atazanavir, efavirenz, indinavir, lopinavir, nelfinavir, tipranavir, ritonavir  St. John's wort  warfarin This list may not describe all possible interactions. Give your health care provider a list of all the medicines, herbs, non-prescription drugs, or   dietary supplements you use. Also tell them if you smoke, drink alcohol, or use illegal drugs. Some items may interact with your medicine. What should I watch for while using this medicine? Visit your doctor or health care professional for regular check ups. See your doctor if you or your partner has sexual  contact with others, becomes HIV positive, or gets a sexual transmitted disease. This product does not protect you against HIV infection (AIDS) or other sexually transmitted diseases. You can check the placement of the IUD yourself by reaching up to the top of your vagina with clean fingers to feel the threads. Do not pull on the threads. It is a good habit to check placement after each menstrual period. Call your doctor right away if you feel more of the IUD than just the threads or if you cannot feel the threads at all. The IUD may come out by itself. You may become pregnant if the device comes out. If you notice that the IUD has come out use a backup birth control method like condoms and call your health care provider. Using tampons will not change the position of the IUD and are okay to use during your period. This IUD can be safely scanned with magnetic resonance imaging (MRI) only under specific conditions. Before you have an MRI, tell your healthcare provider that you have an IUD in place, and which type of IUD you have in place. What side effects may I notice from receiving this medicine? Side effects that you should report to your doctor or health care professional as soon as possible:  allergic reactions like skin rash, itching or hives, swelling of the face, lips, or tongue  fever, flu-like symptoms  genital sores  high blood pressure  no menstrual period for 6 weeks during use  pain, swelling, warmth in the leg  pelvic pain or tenderness  severe or sudden headache  signs of pregnancy  stomach cramping  sudden shortness of breath  trouble with balance, talking, or walking  unusual vaginal bleeding, discharge  yellowing of the eyes or skin Side effects that usually do not require medical attention (report to your doctor or health care professional if they continue or are bothersome):  acne  breast pain  change in sex drive or performance  changes in  weight  cramping, dizziness, or faintness while the device is being inserted  headache  irregular menstrual bleeding within first 3 to 6 months of use  nausea This list may not describe all possible side effects. Call your doctor for medical advice about side effects. You may report side effects to FDA at 1-800-FDA-1088. Where should I keep my medicine? This does not apply. NOTE: This sheet is a summary. It may not cover all possible information. If you have questions about this medicine, talk to your doctor, pharmacist, or health care provider.  2020 Elsevier/Gold Standard (2018-01-06 13:22:01)  

## 2019-12-27 NOTE — Progress Notes (Signed)
Virginia Jackson is a 24 y.o. year old G19P0000 Caucasian female who presents for removal and replacement of a Skyla IUD. She was given informed consent for removal of Skyla and reinsertion of Kyleena. Her Skyla was placed 12/20/2016.   The risks and benefits of the method and placement have been thouroughly reviewed with the patient and all questions were answered.  Specifically the patient is aware of failure rate of 03/998, expulsion of the IUD and of possible perforation.  The patient is aware of irregular bleeding due to the method and understands the incidence of irregular bleeding diminishes with time.  Signed copy of informed consent in chart.   BP 126/80   Pulse (!) 139   Ht 5\' 2"  (1.575 m)   Wt 182 lb 8 oz (82.8 kg)   LMP 12/21/2019 (Approximate)   BMI 33.38 kg/m    Appropriate time out taken. A small plastic speculum was placed in the vagina.  The cervix was visualized, prepped using Betadine. The strings were visible. They were grasped and the 02/20/2020 was easily removed.   The cervix was then grasped with a single-tooth tenaculum. The uterus was sounded to 7 cm. Kyleena IUD placed per manufacturer's recommendations without complications. The strings were trimmed to 3 cm.  The patient tolerated the procedure well.   The patient was given post procedure instructions, including signs and symptoms of infection and to check for the strings after each menses or each month, and refraining from intercourse or anything in the vagina for 3 days.  She was given a [facility] card with date [name] placed, and date [name] to be removed.   Rutha Bouchard, CNM [facility] 12/27/19 4:18 PM  NDC: 12/29/19 Lot: [id number] Exp: 07/2021

## 2020-02-01 NOTE — Progress Notes (Signed)
NEUROLOGY FOLLOW UP OFFICE NOTE  Virginia Jackson [id number]   Subjective:  Virginia Jackson is a 24 year old right-handed female who follows up for migraines.  UPDATE: Discontinued verapamil last year but had increased headaches so she restarted it.  They are back to baseline.   She had a severe intractable migraine 2 to 3 weeks ago lasting 2 days.  This was the first in months.  May have been triggered by not eating for the whole day.  Did not respond to Relpax 40mg  but did not repeat the dose.   Rescue protocol: First-line Excedrinor ibuprofen with food; second line Relpax 40 mg Current NSAIDS:Ibuprofen. Current analgesics:Excedrin Current triptans:Relpax 40 mg Current ergotamine:None Current anti-emetic:None Current muscle relaxants:None Current anti-anxiolytic:Xanax Current sleep aide:None Current Antihypertensive medications:  verapamil CR 180mg  Current Antidepressant medications:None Current Anticonvulsant medications:Topiramate 50 mg at bedtime Current anti-CGRP:None Current Vitamins/Herbal/Supplements:Folic acid Current Antihistamines/Decongestants:None Other therapy:None Hormone/birth control: IUD  Caffeine:No Diet:Eats healthy. Drinks plenty water. Exercise:Routine Depression:No; Anxiety:Yes Other pain:No Sleep hygiene:Good  HISTORY: Onset:  Childhood Location:Varies (frontal, either side) Quality:pounding Initial Intensity:10/10 Aura:Sometimes sees spots in her vision prior to headache onset Prodrome:on Associated symptoms: Nausea, photophobia, phonophobia;Vomiting when severe. Initial Duration:Until she falls asleep.Severe episodes last 1 to 3 days Initial Frequency:Daily.Severe episodes occur once a month to once every other month. Triggers/aggravating factors:  movement, stress Relieving factors:Sleep, vomiting, hot shower Activity:Cannot function when severe (once a month to once every other  month)  Past NSAIDS:Advil, Advil migraine Past analgesics:Fioricet Past abortive triptans:Maxalt, sumatriptan tablet, Zomig 5 mg NS, Zomig 5 mg tablet Past abortive ergotamine:none Past muscle relaxants:none Past anti-emetic:none Past antihypertensive medications:propranolol ER 60mg  Past antidepressant medications:none Past anticonvulsant medications:none Past anti-CGRP:none Past vitamins/Herbal/Supplements:none Past antihistamines/decongestants:none Other past therapies:none  Family history of headache:mom  PAST MEDICAL HISTORY: Past Medical History:  Diagnosis Date  . Anxiety   . Degenerative disc disease, lumbar   . Headache   . Painful menstrual periods     MEDICATIONS: Current Outpatient Medications on File Prior to Visit  Medication Sig Dispense Refill  . ALPRAZolam (XANAX) 0.5 MG tablet Take 1 tablet (0.5 mg total) by mouth 2 (two) times daily as needed for anxiety. Reported on 03/27/2015 60 tablet 2  . cyclobenzaprine (FLEXERIL) 10 MG tablet Take 1 tablet (10 mg total) by mouth 3 (three) times daily as needed for muscle spasms. (Patient not taking: Reported on 02/15/2019) 30 tablet 2  . eletriptan (RELPAX) 40 MG tablet TAKE AT EARLIEST ONSET. MAY REPEAT ONCE IN 2 HOURS IF HEADACHE PERSISTS OR RECURS. (Patient not taking: Reported on 12/27/2019) 27 tablet 2  . fluvoxaMINE (LUVOX) 50 MG tablet Take by mouth.    . folic acid (FOLVITE) 1 MG tablet Take 1 mg by mouth daily.    03/29/2015 ibuprofen (ADVIL,MOTRIN) 600 MG tablet Take 600 mg by mouth every 6 (six) hours as needed. Reported on 07/28/2015    . Levonorgestrel (SKYLA) 13.5 MG IUD by Intrauterine route.    12/29/2019 PARoxetine (PAXIL) 20 MG tablet Take 20 mg by mouth daily.    . QUEtiapine (SEROQUEL) 50 MG tablet Take 50 mg by mouth at bedtime.    . topiramate (TOPAMAX) 50 MG tablet TAKE 1 TABLET BY MOUTH EVERY DAY 90 tablet 2  . traZODone (DESYREL) 50 MG tablet Take by mouth.    . verapamil (CALAN-SR) 180 MG  CR tablet      Current Facility-Administered Medications on File Prior to Visit  Medication Dose Route Frequency Provider Last Rate Last Admin  . Levonorgestrel  IUD 1 Device  1 Device Intrauterine Once Cavalero, Melody N, CNM        ALLERGIES: No Known Allergies  FAMILY HISTORY: Family History  Problem Relation Age of Onset  . Hypertension Maternal Grandmother   . Hypertension Maternal Grandfather   . Cancer Maternal Grandfather        lymphoma   . Cancer Paternal Grandmother        ovarian   . Aneurysm Paternal Grandfather   . Rheum arthritis Mother   . Hypertension Mother     SOCIAL HISTORY: Social History   Socioeconomic History  . Marital status: Single    Spouse name: Not on file  . Number of children: 0  . Years of education: Not on file  . Highest education level: Bachelor's degree (e.g., BA, AB, BS)  Occupational History  . Not on file  Tobacco Use  . Smoking status: Never Smoker  . Smokeless tobacco: Never Used  Vaping Use  . Vaping Use: Never used  Substance and Sexual Activity  . Alcohol use: No    Alcohol/week: 0.0 standard drinks  . Drug use: No  . Sexual activity: Yes    Partners: Male    Birth control/protection: I.U.D.    Comment: skyla  Other Topics Concern  . Not on file  Social History Narrative   Pt is single lives with family   No children   Right handed   Drinks no coffee, tea nor soda, mostly water   exercise 3 times a week   Social Determinants of Health   Financial Resource Strain:   . Difficulty of Paying Living Expenses: Not on file  Food Insecurity:   . Worried About Programme researcher, broadcasting/film/video in the Last Year: Not on file  . Ran Out of Food in the Last Year: Not on file  Transportation Needs:   . Lack of Transportation (Medical): Not on file  . Lack of Transportation (Non-Medical): Not on file  Physical Activity:   . Days of Exercise per Week: Not on file  . Minutes of Exercise per Session: Not on file  Stress:   . Feeling  of Stress : Not on file  Social Connections:   . Frequency of Communication with Friends and Family: Not on file  . Frequency of Social Gatherings with Friends and Family: Not on file  . Attends Religious Services: Not on file  . Active Member of Clubs or Organizations: Not on file  . Attends Banker Meetings: Not on file  . Marital Status: Not on file  Intimate Partner Violence:   . Fear of Current or Ex-Partner: Not on file  . Emotionally Abused: Not on file  . Physically Abused: Not on file  . Sexually Abused: Not on file     Objective:  Blood pressure 111/74, pulse 93, height 5\' 2"  (1.575 m), weight 187 lb (84.8 kg), SpO2 96 %. General: No acute distress.  Patient appears well-groomed.   Head:  Normocephalic/atraumatic Eyes:  Fundi examined but not visualized Neck: supple, no paraspinal tenderness, full range of motion Heart:  Regular rate and rhythm Lungs:  Clear to auscultation bilaterally Back: No paraspinal tenderness Neurological Exam: alert and oriented to person, place, and time. Attention span and concentration intact, recent and remote memory intact, fund of knowledge intact.  Speech fluent and not dysarthric, language intact.  CN II-XII intact. Bulk and tone normal, muscle strength 5/5 throughout.  Sensation to light touch, temperature and vibration intact.  Deep tendon  reflexes 2+ throughout, toes downgoing.  Finger to nose and heel to shin testing intact.  Gait normal, Romberg negative.   Assessment/Plan:   Migraine without aura, without status migrainosus, not intractable  1.  For preventative management, topiramate 50mg  at bedtime and verapamil CR 180mg  daily 2.  For abortive therapy, Excedrin, ibuprofen or Relpax 40mg  3.  Limit use of pain relievers to no more than 2 days out of week to prevent risk of rebound or medication-overuse headache. 4.  Keep headache diary 5.  Follow up one year   , DO  CC: , MD

## 2020-02-07 ENCOUNTER — Ambulatory Visit: Payer: BC Managed Care – PPO | Admitting: Neurology

## 2020-02-07 ENCOUNTER — Other Ambulatory Visit: Payer: Self-pay

## 2020-02-07 ENCOUNTER — Encounter: Payer: Self-pay | Admitting: Neurology

## 2020-02-07 VITALS — BP 111/74 | HR 93 | Ht 62.0 in | Wt 187.0 lb

## 2020-02-07 DIAGNOSIS — G43009 Migraine without aura, not intractable, without status migrainosus: Secondary | ICD-10-CM

## 2020-02-07 MED ORDER — TOPIRAMATE 50 MG PO TABS
50.0000 mg | ORAL_TABLET | Freq: Every day | ORAL | 3 refills | Status: DC
Start: 2020-02-07 — End: 2021-03-26

## 2020-02-07 MED ORDER — ELETRIPTAN HYDROBROMIDE 40 MG PO TABS
ORAL_TABLET | ORAL | 3 refills | Status: DC
Start: 2020-02-07 — End: 2020-08-18

## 2020-02-07 MED ORDER — VERAPAMIL HCL ER 180 MG PO TBCR
180.0000 mg | EXTENDED_RELEASE_TABLET | Freq: Every day | ORAL | 3 refills | Status: DC
Start: 2020-02-07 — End: 2020-02-17

## 2020-02-07 NOTE — Patient Instructions (Signed)
Refilled topiramate, verapamil and eletriptan Follow up one year

## 2020-02-15 ENCOUNTER — Other Ambulatory Visit: Payer: Self-pay | Admitting: Neurology

## 2020-02-15 NOTE — Telephone Encounter (Signed)
Patient needs to have a refill on the topamax ,  relpax and  Verapamil  She uses [facility] in [place] on [address]

## 2020-02-15 NOTE — Telephone Encounter (Signed)
Patient returned call and is aware to call the pharmacy.

## 2020-02-15 NOTE — Telephone Encounter (Signed)
LMOVM Please call pharmacy, Refills done 02/07/20

## 2020-02-17 ENCOUNTER — Other Ambulatory Visit: Payer: Self-pay

## 2020-02-17 ENCOUNTER — Encounter: Payer: Self-pay | Admitting: Certified Nurse Midwife

## 2020-02-17 ENCOUNTER — Encounter: Payer: BC Managed Care – PPO | Admitting: Certified Nurse Midwife

## 2020-02-17 ENCOUNTER — Other Ambulatory Visit (HOSPITAL_COMMUNITY)
Admission: RE | Admit: 2020-02-17 | Discharge: 2020-02-17 | Disposition: A | Discharge status: Home / Self Care | Payer: BC Managed Care – PPO | Source: Ambulatory Visit | Attending: Certified Nurse Midwife | Admitting: Certified Nurse Midwife

## 2020-02-17 ENCOUNTER — Ambulatory Visit (INDEPENDENT_AMBULATORY_CARE_PROVIDER_SITE_OTHER): Payer: BC Managed Care – PPO | Admitting: Certified Nurse Midwife

## 2020-02-17 VITALS — BP 126/85 | HR 104 | Ht 62.0 in | Wt 190.1 lb

## 2020-02-17 DIAGNOSIS — B9689 Other specified bacterial agents as the cause of diseases classified elsewhere: Secondary | ICD-10-CM | POA: Insufficient documentation

## 2020-02-17 DIAGNOSIS — Z113 Encounter for screening for infections with a predominantly sexual mode of transmission: Secondary | ICD-10-CM | POA: Insufficient documentation

## 2020-02-17 DIAGNOSIS — Z975 Presence of (intrauterine) contraceptive device: Secondary | ICD-10-CM | POA: Insufficient documentation

## 2020-02-17 DIAGNOSIS — Z01419 Encounter for gynecological examination (general) (routine) without abnormal findings: Secondary | ICD-10-CM | POA: Insufficient documentation

## 2020-02-17 DIAGNOSIS — N760 Acute vaginitis: Secondary | ICD-10-CM | POA: Insufficient documentation

## 2020-02-17 DIAGNOSIS — Z30431 Encounter for routine checking of intrauterine contraceptive device: Secondary | ICD-10-CM

## 2020-02-17 NOTE — Progress Notes (Addendum)
ANNUAL PREVENTATIVE CARE GYN  ENCOUNTER NOTE  Subjective:       Virginia Jackson is a 24 y.o. G0P0000 female here for a routine annual gynecologic exam.  Current complaints: 1. Needs IUD string check-Kyleena placed on 12/27/2019 2. Requests STI testing  Denies difficulty breathing or respiratory distress, chest pain, abdominal pain, excessive vaginal bleeding, dysuria, and leg pain or swelling.    Gynecologic History  No LMP recorded. (Menstrual status: IUD).  Contraception: IUD, Rutha Bouchard  Last Pap: 12/2016. Results were: Neg/Neg  Obstetric History  OB History  Gravida Para Term Preterm AB Living  0 0 0 0 0 0  SAB IAB Ectopic Multiple Live Births  0 0 0 0      Past Medical History:  Diagnosis Date  . Anxiety   . Degenerative disc disease, lumbar   . Headache   . Painful menstrual periods     No past surgical history on file.  Current Outpatient Medications on File Prior to Visit  Medication Sig Dispense Refill  . ALPRAZolam (XANAX) 0.5 MG tablet Take 1 tablet (0.5 mg total) by mouth 2 (two) times daily as needed for anxiety. Reported on 03/27/2015 60 tablet 2  . eletriptan (RELPAX) 40 MG tablet May repeat in 2 hours if headache persists or recurs. 27 tablet 3  . folic acid (FOLVITE) 1 MG tablet Take 1 mg by mouth daily.    Marland Kitchen ibuprofen (ADVIL,MOTRIN) 600 MG tablet Take 600 mg by mouth every 6 (six) hours as needed. Reported on 07/28/2015    . levonorgestrel (KYLEENA) 19.5 MG IUD by Intrauterine route once.    . topiramate (TOPAMAX) 50 MG tablet Take 1 tablet (50 mg total) by mouth daily. 90 tablet 3  . traZODone (DESYREL) 50 MG tablet Take by mouth.    . fluvoxaMINE (LUVOX) 50 MG tablet Take by mouth.    . Levonorgestrel 13.5 MG IUD by Intrauterine route.     Current Facility-Administered Medications on File Prior to Visit  Medication Dose Route Frequency Provider Last Rate Last Admin  . Levonorgestrel IUD 1 Device  1 Device Intrauterine Once Goodrich, Melody N, CNM         No Known Allergies  Social History   Socioeconomic History  . Marital status: Single    Spouse name: Not on file  . Number of children: 0  . Years of education: Not on file  . Highest education level: Bachelor's degree (e.g., BA, AB, BS)  Occupational History  . Not on file  Tobacco Use  . Smoking status: Never Smoker  . Smokeless tobacco: Never Used  Vaping Use  . Vaping Use: Never used  Substance and Sexual Activity  . Alcohol use: No    Alcohol/week: 0.0 standard drinks  . Drug use: No  . Sexual activity: Yes    Partners: Male    Birth control/protection: I.U.D.    Comment: skyla  Other Topics Concern  . Not on file  Social History Narrative   Pt is single lives with family   No children   Right handed   Drinks no coffee, tea nor soda, mostly water   exercise 3 times a week   Social Determinants of Health   Financial Resource Strain: Not on file  Food Insecurity: Not on file  Transportation Needs: Not on file  Physical Activity: Not on file  Stress: Not on file  Social Connections: Not on file  Intimate Partner Violence: Not on file    Family History  Problem  Relation Age of Onset  . Hypertension Maternal Grandmother   . Hypertension Maternal Grandfather   . Cancer Maternal Grandfather        lymphoma   . Cancer Paternal Grandmother        ovarian   . Aneurysm Paternal Grandfather   . Rheum arthritis Mother   . Hypertension Mother     The following portions of the patient's history were reviewed and updated as appropriate: allergies, current medications, past family history, past medical history, past social history, past surgical history and problem list.  Review of Systems  ROS negative except as noted above. Information obtained from patient.    Objective:   BP 126/85   Pulse (!) 104   Ht 5\' 2"  (1.575 m)   Wt 190 lb 2 oz (86.2 kg)   BMI 34.77 kg/m   CONSTITUTIONAL: Well-developed, well-nourished female in no acute distress.    PSYCHIATRIC: Normal mood and affect. Normal behavior. Normal judgment and thought content.  NEUROLGIC: Alert and oriented to person, place, and time. Normal muscle tone coordination. No cranial nerve deficit noted.  HENT:  Normocephalic, atraumatic, External right and left  ear normal.   EYES: Conjunctivae and EOM are normal. Pupils are equal and round.   NECK: Normal range of motion, supple, no masses.  Normal thyroid.  SKIN: Skin is warm and dry. No rash noted. Not diaphoretic. No erythema. No pallor. Professional tattoos present.   CARDIOVASCULAR: Normal heart rate noted, regular rhythm, no murmur.  RESPIRATORY: Clear to auscultation bilaterally. Effort and breath sounds normal, no problems with respiration noted.  BREASTS: Exam declined by patient  ABDOMEN: Soft, normal bowel sounds, no distention noted.  No tenderness, rebound or guarding.   PELVIC:  External Genitalia: Normal  Vagina: Normal, vaginal swab collected  Cervix: Normal, IUD strings present 3 cm in length  MUSCULOSKELETAL: Normal range of motion. No tenderness.  No cyanosis, clubbing, or edema.  2+ distal pulses.  LYMPHATIC: No Axillary, Supraclavicular, or Inguinal Adenopathy.  Assessment:   Annual gynecologic examination 24 y.o.   Contraception: IUD, Kyleena   Obesity 1   Problem List Items Addressed This Visit      Other   IUD (intrauterine device) in place   Relevant Orders   Cervicovaginal ancillary only    Other Visit Diagnoses    Well woman exam    -  Primary   Relevant Orders   Cervicovaginal ancillary only   IUD check up       Routine screening for STI (sexually transmitted infection)       Relevant Orders   Cervicovaginal ancillary only      Plan:   Pap: Not needed  Labs: See orders  Routine preventative health maintenance measures emphasized: Exercise/Diet/Weight control, Tobacco Warnings, Alcohol/Substance use risks and Stress Management; see AVS  Reviewed red flag  symptoms and when to call  Return to Clinic - 1 Year for 30 and IUD check or sooner if needed   [employer], CNM  [facility], [facility] 02/17/20 3:40 PM

## 2020-02-17 NOTE — Patient Instructions (Signed)
 Preventive Care 21-24 Years Old, Female Preventive care refers to visits with your health care provider and lifestyle choices that can promote health and wellness. This includes:  A yearly physical exam. This may also be called an annual well check.  Regular dental visits and eye exams.  Immunizations.  Screening for certain conditions.  Healthy lifestyle choices, such as eating a healthy diet, getting regular exercise, not using drugs or products that contain nicotine and tobacco, and limiting alcohol use. What can I expect for my preventive care visit? Physical exam Your health care provider will check your:  Height and weight. This may be used to calculate body mass index (BMI), which tells if you are at a healthy weight.  Heart rate and blood pressure.  Skin for abnormal spots. Counseling Your health care provider may ask you questions about your:  Alcohol, tobacco, and drug use.  Emotional well-being.  Home and relationship well-being.  Sexual activity.  Eating habits.  Work and work environment.  Method of birth control.  Menstrual cycle.  Pregnancy history. What immunizations do I need?  Influenza (flu) vaccine  This is recommended every year. Tetanus, diphtheria, and pertussis (Tdap) vaccine  You may need a Td booster every 10 years. Varicella (chickenpox) vaccine  You may need this if you have not been vaccinated. Human papillomavirus (HPV) vaccine  If recommended by your health care provider, you may need three doses over 6 months. Measles, mumps, and rubella (MMR) vaccine  You may need at least one dose of MMR. You may also need a second dose. Meningococcal conjugate (MenACWY) vaccine  One dose is recommended if you are age 19-21 years and a first-year college student living in a residence hall, or if you have one of several medical conditions. You may also need additional booster doses. Pneumococcal conjugate (PCV13) vaccine  You may need  this if you have certain conditions and were not previously vaccinated. Pneumococcal polysaccharide (PPSV23) vaccine  You may need one or two doses if you smoke cigarettes or if you have certain conditions. Hepatitis A vaccine  You may need this if you have certain conditions or if you travel or work in places where you may be exposed to hepatitis A. Hepatitis B vaccine  You may need this if you have certain conditions or if you travel or work in places where you may be exposed to hepatitis B. Haemophilus influenzae type b (Hib) vaccine  You may need this if you have certain conditions. You may receive vaccines as individual doses or as more than one vaccine together in one shot (combination vaccines). Talk with your health care provider about the risks and benefits of combination vaccines. What tests do I need?  Blood tests  Lipid and cholesterol levels. These may be checked every 5 years starting at age 20.  Hepatitis C test.  Hepatitis B test. Screening  Diabetes screening. This is done by checking your blood sugar (glucose) after you have not eaten for a while (fasting).  Sexually transmitted disease (STD) testing.  BRCA-related cancer screening. This may be done if you have a family history of breast, ovarian, tubal, or peritoneal cancers.  Pelvic exam and Pap test. This may be done every 3 years starting at age 21. Starting at age 30, this may be done every 5 years if you have a Pap test in combination with an HPV test. Talk with your health care provider about your test results, treatment options, and if necessary, the need for more   Follow these instructions at home: Eating and drinking   Eat a diet that includes fresh fruits and vegetables, whole grains, lean protein, and low-fat dairy.  Take vitamin and mineral supplements as recommended by your health care provider.  Do not drink alcohol if: ? Your health care provider tells you not to drink. ? You are  pregnant, may be pregnant, or are planning to become pregnant.  If you drink alcohol: ? Limit how much you have to 0-1 drink a day. ? Be aware of how much alcohol is in your drink. In the [place] one drink equals one 12 oz bottle of beer (355 mL), one 5 oz [place] of wine (148 mL), or one 1 oz Samarin of hard liquor (44 mL). Lifestyle  Take daily care of your teeth and gums.  Stay active. Exercise for at least 30 minutes on 5 or more days each week.  Do not use any products that contain nicotine or tobacco, such as cigarettes, e-cigarettes, and chewing tobacco. If you need help quitting, ask your health care provider.  If you are sexually active, practice safe sex. Use a condom or other form of birth control (contraception) in order to prevent pregnancy and STIs (sexually transmitted infections). If you plan to become pregnant, see your health care provider for a preconception visit. What's next?  Visit your health care provider once a year for a well check visit.  Ask your health care provider how often you should have your eyes and teeth checked.  Stay up to date on all vaccines. This information is not intended to replace advice given to you by your health care provider. Make sure you discuss any questions you have with your health care provider. Document Revised: 11/06/2017 Document Reviewed: 11/06/2017 Elsevier Patient Education  2020 Elsevier Inc.    Levonorgestrel intrauterine device (IUD) What is this medicine? LEVONORGESTREL IUD (LEE voe nor jes trel) is a contraceptive (birth control) device. The device is placed inside the uterus by a healthcare professional. It is used to prevent pregnancy. This device can also be used to treat heavy bleeding that occurs during your period. This medicine may be used for other purposes; ask your health care provider or pharmacist if you have questions. COMMON BRAND NAME(S): Kyleena, LILETTA, Mirena, Skyla What should I tell my health care  provider before I take this medicine? They need to know if you have any of these conditions:  abnormal Pap smear  cancer of the breast, uterus, or cervix  diabetes  endometritis  genital or pelvic infection now or in the past  have more than one sexual partner or your partner has more than one partner  heart disease  history of an ectopic or tubal pregnancy  immune system problems  IUD in place  liver disease or tumor  problems with blood clots or take blood-thinners  seizures  use intravenous drugs  uterus of unusual shape  vaginal bleeding that has not been explained  an unusual or allergic reaction to levonorgestrel, other hormones, silicone, or polyethylene, medicines, foods, dyes, or preservatives  pregnant or trying to get pregnant  breast-feeding How should I use this medicine? This device is placed inside the uterus by a health care professional. Talk to your pediatrician regarding the use of this medicine in children. Special care may be needed. Overdosage: If you think you have taken too much of this medicine contact a poison control center or emergency room at once. NOTE: This medicine is only for you. Do not   share this medicine with others. What if I miss a dose? This does not apply. Depending on the brand of device you have inserted, the device will need to be replaced every 3 to 6 years if you wish to continue using this type of birth control. What may interact with this medicine? Do not take this medicine with any of the following medications:  amprenavir  bosentan  fosamprenavir This medicine may also interact with the following medications:  aprepitant  armodafinil  barbiturate medicines for inducing sleep or treating seizures  bexarotene  boceprevir  griseofulvin  medicines to treat seizures like carbamazepine, ethotoin, felbamate, oxcarbazepine, phenytoin,  topiramate  modafinil  pioglitazone  rifabutin  rifampin  rifapentine  some medicines to treat HIV infection like atazanavir, efavirenz, indinavir, lopinavir, nelfinavir, tipranavir, ritonavir  St. John's wort  warfarin This list may not describe all possible interactions. Give your health care provider a list of all the medicines, herbs, non-prescription drugs, or dietary supplements you use. Also tell them if you smoke, drink alcohol, or use illegal drugs. Some items may interact with your medicine. What should I watch for while using this medicine? Visit your doctor or health care professional for regular check ups. See your doctor if you or your partner has sexual contact with others, becomes HIV positive, or gets a sexual transmitted disease. This product does not protect you against HIV infection (AIDS) or other sexually transmitted diseases. You can check the placement of the IUD yourself by reaching up to the top of your vagina with clean fingers to feel the threads. Do not pull on the threads. It is a good habit to check placement after each menstrual period. Call your doctor right away if you feel more of the IUD than just the threads or if you cannot feel the threads at all. The IUD may come out by itself. You may become pregnant if the device comes out. If you notice that the IUD has come out use a backup birth control method like condoms and call your health care provider. Using tampons will not change the position of the IUD and are okay to use during your period. This IUD can be safely scanned with magnetic resonance imaging (MRI) only under specific conditions. Before you have an MRI, tell your healthcare provider that you have an IUD in place, and which type of IUD you have in place. What side effects may I notice from receiving this medicine? Side effects that you should report to your doctor or health care professional as soon as possible:  allergic reactions like skin  rash, itching or hives, swelling of the face, lips, or tongue  fever, flu-like symptoms  genital sores  high blood pressure  no menstrual period for 6 weeks during use  pain, swelling, warmth in the leg  pelvic pain or tenderness  severe or sudden headache  signs of pregnancy  stomach cramping  sudden shortness of breath  trouble with balance, talking, or walking  unusual vaginal bleeding, discharge  yellowing of the eyes or skin Side effects that usually do not require medical attention (report to your doctor or health care professional if they continue or are bothersome):  acne  breast pain  change in sex drive or performance  changes in weight  cramping, dizziness, or faintness while the device is being inserted  headache  irregular menstrual bleeding within first 3 to 6 months of use  nausea This list may not describe all possible side effects. Call your doctor   for medical advice about side effects. You may report side effects to FDA at 1-800-FDA-1088. Where should I keep my medicine? This does not apply. NOTE: This sheet is a summary. It may not cover all possible information. If you have questions about this medicine, talk to your doctor, pharmacist, or health care provider.  2020 Elsevier/Gold Standard (2018-01-06 13:22:01)  

## 2020-02-21 ENCOUNTER — Other Ambulatory Visit: Payer: Self-pay

## 2020-02-21 LAB — CERVICOVAGINAL ANCILLARY ONLY
Bacterial Vaginitis (gardnerella): POSITIVE — AB
Candida Glabrata: NEGATIVE
Candida Vaginitis: NEGATIVE
Chlamydia: NEGATIVE
Comment: NEGATIVE
Comment: NEGATIVE
Comment: NEGATIVE
Comment: NEGATIVE
Comment: NEGATIVE
Comment: NORMAL
Neisseria Gonorrhea: NEGATIVE
Trichomonas: NEGATIVE

## 2020-02-21 MED ORDER — METRONIDAZOLE 500 MG PO TABS
500.0000 mg | ORAL_TABLET | Freq: Two times a day (BID) | ORAL | 0 refills | Status: DC
Start: 2020-02-21 — End: 2020-08-18

## 2020-02-21 NOTE — Progress Notes (Signed)
Rx Flagyl as requested by pt.

## 2020-06-22 ENCOUNTER — Other Ambulatory Visit: Payer: Self-pay | Admitting: Neurology

## 2020-07-07 ENCOUNTER — Ambulatory Visit: Payer: Self-pay | Admitting: Physician Assistant

## 2020-07-07 DIAGNOSIS — Z113 Encounter for screening for infections with a predominantly sexual mode of transmission: Secondary | ICD-10-CM

## 2020-07-07 LAB — WET PREP FOR TRICH, YEAST, CLUE
Trichomonas Exam: NEGATIVE
Yeast Exam: NEGATIVE

## 2020-07-08 ENCOUNTER — Encounter: Payer: Self-pay | Admitting: Physician Assistant

## 2020-07-08 NOTE — Progress Notes (Signed)
[facility] Department STI clinic/screening visit  Subjective:  Virginia Jackson is a 25 y.o. female being seen today for an STI screening visit. The patient reports they do not have symptoms.  Patient reports that they do not desire a pregnancy in the next year.   They reported they are not interested in discussing contraception today.  No LMP recorded. (Menstrual status: IUD).   Patient has the following medical conditions:   Patient Active Problem List   Diagnosis Date Noted  . IUD (intrauterine device) in place 02/17/2020  . Secondary amenorrhea 10/25/2015  . Family history of ovarian cancer 10/25/2015  . Acne 01/04/2015  . Migraine without aura and without status migrainosus, not intractable 12/14/2014    Chief Complaint  Patient presents with  . SEXUALLY TRANSMITTED DISEASE    screening    HPI  Patient reports that she is not having any symptoms but would like a screening today.  Denies surgeries.  States that she has anxiety and is in counseling.  Has medicines that she takes prn only.  Reports her last HIV test was in 02/2020 when she also last had her pap done.  Has an IUD and does not have a regular period with this BCM.   See flowsheet for further details and programmatic requirements.    The following portions of the patient's history were reviewed and updated as appropriate: allergies, current medications, past medical history, past social history, past surgical history and problem list.  Objective:  There were no vitals filed for this visit.  Physical Exam Constitutional:      General: She is not in acute distress.    Appearance: Normal appearance.  HENT:     Head: Normocephalic and atraumatic.     Comments: No nits,lice, or hair loss. No cervical, supraclavicular or axillary adenopathy.    Mouth/Throat:     Mouth: Mucous membranes are moist.     Pharynx: Oropharynx is clear. No oropharyngeal exudate or posterior oropharyngeal erythema.  Eyes:      Conjunctiva/sclera: Conjunctivae normal.  Pulmonary:     Effort: Pulmonary effort is normal.  Abdominal:     Palpations: Abdomen is soft. There is no mass.     Tenderness: There is no abdominal tenderness. There is no guarding or rebound.  Genitourinary:    General: Normal vulva.     Rectum: Normal.     Comments: External genitalia/pubic area without nits, lice, edema, erythema, lesions and inguinal adenopathy. Vagina with normal mucosa and discharge. Cervix without visible lesions. IUD strings visualized and palpated on exam. Uterus firm, mobile, nt, no masses, no CMT, no adnexal tenderness or fullness. Musculoskeletal:     Cervical back: Neck supple. No tenderness.  Skin:    General: Skin is warm and dry.     Findings: No bruising, erythema, lesion or rash.  Neurological:     Mental Status: She is alert and oriented to person, place, and time.  Psychiatric:        Mood and Affect: Mood normal.        Behavior: Behavior normal.        Thought Content: Thought content normal.        Judgment: Judgment normal.      Assessment and Plan:  Virginia Jackson is a 25 y.o. female presenting to the [facility] Department for STI screening  1. Screening for STD (sexually transmitted disease) Patient into clinic without symptoms. Reviewed with patient wet mount results and that no treatment  is indicated today. Rec condoms with all sex. Await test results.  Counseled that RN will call if needs to RTC for treatment once results are back. - WET PREP FOR TRICH, YEAST, CLUE - Gonococcus culture - Chlamydia/Gonorrhea [facility]     No follow-ups on file.  Future Appointments  Date Time Provider Department Center  02/06/2021  8:50 AM Drema Dallas, DO [facility] None  02/22/2021  9:00 AM Lawhorn, Vanessa [place], CNM [facility] None    Matt Holmes, [place]

## 2020-07-11 LAB — GONOCOCCUS CULTURE

## 2020-08-15 ENCOUNTER — Inpatient Hospital Stay: Payer: 59

## 2020-08-15 ENCOUNTER — Encounter: Payer: Self-pay | Admitting: Surgery

## 2020-08-15 ENCOUNTER — Inpatient Hospital Stay
Admission: AD | Admit: 2020-08-15 | Discharge: 2020-08-18 | DRG: 603 | Disposition: A | Discharge status: Home / Self Care | Payer: 59 | Source: Ambulatory Visit | Attending: Surgery | Admitting: Surgery

## 2020-08-15 ENCOUNTER — Other Ambulatory Visit: Payer: Self-pay

## 2020-08-15 DIAGNOSIS — Z8249 Family history of ischemic heart disease and other diseases of the circulatory system: Secondary | ICD-10-CM

## 2020-08-15 DIAGNOSIS — L02214 Cutaneous abscess of groin: Secondary | ICD-10-CM | POA: Diagnosis present

## 2020-08-15 DIAGNOSIS — Z79899 Other long term (current) drug therapy: Secondary | ICD-10-CM | POA: Diagnosis not present

## 2020-08-15 DIAGNOSIS — L03115 Cellulitis of right lower limb: Secondary | ICD-10-CM | POA: Diagnosis present

## 2020-08-15 DIAGNOSIS — Z20822 Contact with and (suspected) exposure to covid-19: Secondary | ICD-10-CM | POA: Diagnosis present

## 2020-08-15 DIAGNOSIS — Z8261 Family history of arthritis: Secondary | ICD-10-CM

## 2020-08-15 DIAGNOSIS — L02415 Cutaneous abscess of right lower limb: Principal | ICD-10-CM | POA: Diagnosis present

## 2020-08-15 LAB — CBC
HCT: 39.4 % (ref 36.0–46.0)
Hemoglobin: 13.2 g/dL (ref 12.0–15.0)
MCH: 29.6 pg (ref 26.0–34.0)
MCHC: 33.5 g/dL (ref 30.0–36.0)
MCV: 88.3 fL (ref 80.0–100.0)
Platelets: 252 10*3/uL (ref 150–400)
RBC: 4.46 MIL/uL (ref 3.87–5.11)
RDW: 11.8 % (ref 11.5–15.5)
WBC: 15.7 10*3/uL — ABNORMAL HIGH (ref 4.0–10.5)
nRBC: 0 % (ref 0.0–0.2)

## 2020-08-15 LAB — BASIC METABOLIC PANEL
Anion gap: 6 (ref 5–15)
BUN: 16 mg/dL (ref 6–20)
CO2: 21 mmol/L — ABNORMAL LOW (ref 22–32)
Calcium: 8.9 mg/dL (ref 8.9–10.3)
Chloride: 112 mmol/L — ABNORMAL HIGH (ref 98–111)
Creatinine, Ser: 0.97 mg/dL (ref 0.44–1.00)
GFR, Estimated: >60 mL/min (ref >60)
Glucose, Bld: 93 mg/dL (ref 70–99)
Potassium: 3.3 mmol/L — ABNORMAL LOW (ref 3.5–5.1)
Sodium: 139 mmol/L (ref 135–145)

## 2020-08-15 LAB — HIV ANTIBODY (ROUTINE TESTING W REFLEX): HIV Screen 4th Generation wRfx: NONREACTIVE

## 2020-08-15 LAB — MAGNESIUM: Magnesium: 2 mg/dL (ref 1.7–2.4)

## 2020-08-15 LAB — PHOSPHORUS: Phosphorus: 2.6 mg/dL (ref 2.5–4.6)

## 2020-08-15 MED ORDER — DOCUSATE SODIUM 100 MG PO CAPS
100.0000 mg | ORAL_CAPSULE | Freq: Two times a day (BID) | ORAL | Status: DC | PRN
  Administered 2020-08-17: 100 mg via ORAL
  Filled 2020-08-15: qty 1

## 2020-08-15 MED ORDER — VANCOMYCIN HCL 1000 MG/200ML IV SOLN
1000.0000 mg | Freq: Once | INTRAVENOUS | Status: DC
  Filled 2020-08-15: qty 200

## 2020-08-15 MED ORDER — PANTOPRAZOLE SODIUM 40 MG IV SOLR
40.0000 mg | Freq: Every day | INTRAVENOUS | Status: DC
  Administered 2020-08-15: 40 mg via INTRAVENOUS
  Filled 2020-08-15: qty 40

## 2020-08-15 MED ORDER — VANCOMYCIN HCL IN DEXTROSE 1-5 GM/200ML-% IV SOLN
1000.0000 mg | Freq: Two times a day (BID) | INTRAVENOUS | Status: DC
  Administered 2020-08-16 – 2020-08-18 (×5): 1000 mg via INTRAVENOUS
  Filled 2020-08-15 (×8): qty 200

## 2020-08-15 MED ORDER — VANCOMYCIN HCL 2000 MG/400ML IV SOLN
2000.0000 mg | Freq: Once | INTRAVENOUS | Status: AC
  Administered 2020-08-15: 2000 mg via INTRAVENOUS
  Filled 2020-08-15: qty 400

## 2020-08-15 MED ORDER — MORPHINE SULFATE (PF) 2 MG/ML IV SOLN
2.0000 mg | INTRAVENOUS | Status: DC | PRN
  Administered 2020-08-15 – 2020-08-17 (×3): 2 mg via INTRAVENOUS
  Filled 2020-08-15 (×3): qty 1

## 2020-08-15 MED ORDER — HYDROCODONE-ACETAMINOPHEN 5-325 MG PO TABS
1.0000 | ORAL_TABLET | ORAL | Status: DC | PRN
  Administered 2020-08-15 – 2020-08-16 (×3): 1 via ORAL
  Administered 2020-08-17 (×2): 2 via ORAL
  Administered 2020-08-17 – 2020-08-18 (×2): 1 via ORAL
  Administered 2020-08-18 (×2): 2 via ORAL
  Filled 2020-08-15: qty 1
  Filled 2020-08-15: qty 2
  Filled 2020-08-15: qty 1
  Filled 2020-08-15 (×3): qty 2
  Filled 2020-08-15 (×2): qty 1
  Filled 2020-08-15: qty 2

## 2020-08-15 MED ORDER — ONDANSETRON HCL 4 MG/2ML IJ SOLN: 4.0000 mg | Freq: Four times a day (QID) | INTRAMUSCULAR | Status: DC | PRN

## 2020-08-15 MED ORDER — SODIUM CHLORIDE 0.9 % IV SOLN: INTRAVENOUS | Status: DC

## 2020-08-15 MED ORDER — ONDANSETRON 4 MG PO TBDP
4.0000 mg | ORAL_TABLET | Freq: Four times a day (QID) | ORAL | Status: DC | PRN
  Filled 2020-08-15: qty 1

## 2020-08-15 MED ORDER — IOHEXOL 300 MG/ML  SOLN
100.0000 mL | Freq: Once | INTRAMUSCULAR | Status: AC | PRN
  Administered 2020-08-15: 100 mL via INTRAVENOUS

## 2020-08-15 MED ORDER — TRAMADOL HCL 50 MG PO TABS
50.0000 mg | ORAL_TABLET | Freq: Four times a day (QID) | ORAL | Status: DC | PRN
Start: 2020-08-15 — End: 2020-08-19

## 2020-08-15 MED ORDER — VANCOMYCIN HCL 1000 MG/200ML IV SOLN
1000.0000 mg | Freq: Two times a day (BID) | INTRAVENOUS | Status: DC
  Filled 2020-08-15: qty 200

## 2020-08-15 NOTE — Consult Note (Signed)
Pharmacy Antibiotic Note  Virginia Jackson is a 25 y.o. female admitted on 08/15/2020 with right groin abscess. Per chart review, patient reports pain radiating down leg which is concerning for infectious spread to fascia layer. Currently awaiting imaging. General surgery admission. Pharmacy has been consulted for vancomycin dosing.  Plan:  Vancomycin 2 g IV LD x 1 followed by maintenance regimen of vancomycin 1 g IV q12h --Calculated AUC 523, Cmin 15.5 --Daily Scr per protocol --Levels at steady state as indicated   Height: 5\' 3"  (160 cm) Weight: 83 kg (182 lb 15.7 oz) IBW/kg (Calculated) : 52.4  Temp (24hrs), Avg:98.3 F (36.8 C), Min:98.1 F (36.7 C), Max:98.5 F (36.9 C)  Recent Labs  Lab 08/15/20 1850  WBC 15.7*  CREATININE 0.97    Estimated Creatinine Clearance: 91.2 mL/min (by C-G formula based on SCr of 0.97 mg/dL).    No Known Allergies  Antimicrobials this admission: Vancomycin 6/7 >>   Dose adjustments this admission: N/A  Microbiology results: N/A  Thank you for allowing pharmacy to be a part of this patient's care.  8/7 08/15/2020 8:28 PM

## 2020-08-15 NOTE — H&P (Signed)
Subjective:   CC: right leg abscess  HPI:  Virginia Jackson is a 25 y.o. female who was consulted by [facility] for evaluation of above.  First noted a few days ago.  Symptoms include: Pain is sharp, increasing, radiating down leg.  Exacerbated by movement.  Alleviated by nothing specific.  Associated with purulent discharge initially, but now only scant bloody drainage.     Past Medical History:  has a past medical history of Anxiety, Degenerative disc disease, lumbar, Headache, and Painful menstrual periods.  Past Surgical History: none reported  Family History: family history includes Aneurysm in her paternal grandfather; Cancer in her maternal grandfather and paternal grandmother; Hypertension in her maternal grandfather, maternal grandmother, and mother; Rheum arthritis in her mother.  Social History:  reports that she has never smoked. She has never used smokeless tobacco. She reports that she does not drink alcohol and does not use drugs.  Current Medications:  Prior to Admission medications   Medication Sig Start Date End Date Taking? Authorizing Provider  ALPRAZolam Prudy Feeler) 0.5 MG tablet Take 1 tablet (0.5 mg total) by mouth 2 (two) times daily as needed for anxiety. Reported on 03/27/2015 02/24/18   Purcell Nails, CNM  eletriptan (RELPAX) 40 MG tablet May repeat in 2 hours if headache persists or recurs. 02/07/20   Drema Dallas, DO  fluvoxaMINE (LUVOX) 50 MG tablet Take by mouth. 07/29/18 08/28/18  [provider]  folic acid (FOLVITE) 1 MG tablet Take 1 mg by mouth daily.    [provider]  ibuprofen (ADVIL,MOTRIN) 600 MG tablet Take 600 mg by mouth every 6 (six) hours as needed. Reported on 07/28/2015    [provider]  levonorgestrel Rutha Bouchard) 19.5 MG IUD by Intrauterine route once.    [provider]  metroNIDAZOLE (FLAGYL) 500 MG tablet Take 1 tablet (500 mg total) by mouth 2 (two) times daily. 02/21/20   Lawhorn, Vanessa [place], CNM   topiramate (TOPAMAX) 50 MG tablet Take 1 tablet (50 mg total) by mouth daily. 02/07/20   Drema Dallas, DO  traZODone (DESYREL) 50 MG tablet Take by mouth. 09/24/19 09/23/20  [provider]    Allergies:  No Known Allergies  ROS:  General: Denies weight loss, weight gain, fatigue, fevers, chills, and night sweats. Eyes: Denies blurry vision, double vision, eye pain, itchy eyes, and tearing. Ears: Denies hearing loss, earache, and ringing in ears. Nose: Denies sinus pain, congestion, infections, runny nose, and nosebleeds. Mouth/throat: Denies hoarseness, sore throat, bleeding gums, and difficulty swallowing. Heart: Denies chest pain, palpitations, racing heart, irregular heartbeat, leg pain or swelling, and decreased activity tolerance. Respiratory: Denies breathing difficulty, shortness of breath, wheezing, cough, and sputum. GI: Denies change in appetite, heartburn, nausea, vomiting, constipation, diarrhea, and blood in stool. GU: Denies difficulty urinating, pain with urinating, urgency, frequency, blood in urine. Musculoskeletal: Denies joint stiffness, pain, swelling, muscle weakness. Skin: Denies rash, itching, mass, tumors, sores, and boils Neurologic: Denies headache, fainting, dizziness, seizures, numbness, and tingling. Psychiatric: Denies depression, anxiety, difficulty sleeping, and memory loss. Endocrine: Denies heat or cold intolerance, and increased thirst or urination. Blood/lymph: Denies easy bruising, easy bruising, and swollen glands     Objective:    BP 110/72  Pulse 94  Ht 160 cm (5\' 3" )  Wt 82.1 kg (181 lb)  SpO2 99%  BMI 32.06 kg/m     Constitutional :  alert, cooperative, appears stated age and no distress  Lymphatics/Throat:  no asymmetry, masses, or scars  Respiratory:  clear to auscultation bilaterally  Cardiovascular:  regular rate and rhythm  Gastrointestinal: soft, non-tender; bowel sounds normal; no masses,  no organomegaly.   Musculoskeletal: Steady gait and movement  Skin: Cool and moist, RIGHT medial thigh near groin, with obvious cellulitis and possible fluctuance at pinpoint follicle consistent with right leg abscess.  TTP extends from this area toward posterior knee.  Sightl increased erythema extending down leg as well, but no obvious crepitance   Psychiatric: Normal affect, non-agitated, not confused       LABS:  n/a  RADS: Pending CT leg  Assessment:      Right groin abscess, with pain radiating down leg, concern for possible infectious spread within fascia layer.  Plan:     Admit to hospital, IVF, NPO, IV Abx, CT LEG to determine extent of infection prior to proceeding with likely I&D.    lternatives include continued observation.  Benefits include possible symptom relief, pathologic evaluation. Discussed the risk of surgery including recurrence, chronic pain, post-op infxn, poor cosmesis, poor/delayed wound healing, and possible re-operation to address said risks. The risks of general anesthetic, if used, includes MI, CVA, sudden death or even reaction to anesthetic medications also discussed.   The patient and mother verbalized understanding and all questions were answered to the patient's satisfaction. Pending bed availability.  Time of consult at 1625.  Ordered completed at 1700

## 2020-08-16 LAB — CBC
HCT: 39.8 % (ref 36.0–46.0)
Hemoglobin: 13.3 g/dL (ref 12.0–15.0)
MCH: 29.6 pg (ref 26.0–34.0)
MCHC: 33.4 g/dL (ref 30.0–36.0)
MCV: 88.6 fL (ref 80.0–100.0)
Platelets: 262 10*3/uL (ref 150–400)
RBC: 4.49 MIL/uL (ref 3.87–5.11)
RDW: 11.9 % (ref 11.5–15.5)
WBC: 18.6 10*3/uL — ABNORMAL HIGH (ref 4.0–10.5)
nRBC: 0 % (ref 0.0–0.2)

## 2020-08-16 LAB — PHOSPHORUS: Phosphorus: 2.9 mg/dL (ref 2.5–4.6)

## 2020-08-16 LAB — BASIC METABOLIC PANEL
Anion gap: 6 (ref 5–15)
BUN: 12 mg/dL (ref 6–20)
CO2: 20 mmol/L — ABNORMAL LOW (ref 22–32)
Calcium: 8.4 mg/dL — ABNORMAL LOW (ref 8.9–10.3)
Chloride: 109 mmol/L (ref 98–111)
Creatinine, Ser: 0.88 mg/dL (ref 0.44–1.00)
GFR, Estimated: >60 mL/min (ref >60)
Glucose, Bld: 105 mg/dL — ABNORMAL HIGH (ref 70–99)
Potassium: 3.7 mmol/L (ref 3.5–5.1)
Sodium: 135 mmol/L (ref 135–145)

## 2020-08-16 LAB — MAGNESIUM: Magnesium: 1.9 mg/dL (ref 1.7–2.4)

## 2020-08-16 LAB — SARS CORONAVIRUS 2 (TAT 6-24 HRS): SARS Coronavirus 2: NEGATIVE

## 2020-08-16 MED ORDER — VERAPAMIL HCL ER 180 MG PO TBCR
180.0000 mg | EXTENDED_RELEASE_TABLET | Freq: Every day | ORAL | Status: DC
  Administered 2020-08-17 – 2020-08-18 (×2): 180 mg via ORAL
  Filled 2020-08-16 (×3): qty 1

## 2020-08-16 MED ORDER — DIPHENHYDRAMINE HCL 25 MG PO CAPS
25.0000 mg | ORAL_CAPSULE | Freq: Four times a day (QID) | ORAL | Status: DC | PRN
  Administered 2020-08-16: 25 mg via ORAL
  Filled 2020-08-16: qty 1

## 2020-08-16 MED ORDER — BACLOFEN 10 MG PO TABS
20.0000 mg | ORAL_TABLET | Freq: Two times a day (BID) | ORAL | Status: DC
  Administered 2020-08-17 – 2020-08-18 (×4): 20 mg via ORAL
  Filled 2020-08-16 (×6): qty 2

## 2020-08-16 MED ORDER — ALPRAZOLAM 0.5 MG PO TABS: 0.5000 mg | ORAL_TABLET | Freq: Two times a day (BID) | ORAL | Status: DC | PRN

## 2020-08-16 MED ORDER — BUTALBITAL-APAP-CAFFEINE 50-325-40 MG PO TABS
2.0000 | ORAL_TABLET | Freq: Four times a day (QID) | ORAL | Status: DC | PRN
  Administered 2020-08-16 – 2020-08-18 (×3): 2 via ORAL
  Filled 2020-08-16 (×3): qty 2

## 2020-08-16 MED ORDER — PROPRANOLOL HCL ER 60 MG PO CP24
60.0000 mg | ORAL_CAPSULE | Freq: Every day | ORAL | Status: DC
  Administered 2020-08-17 – 2020-08-18 (×2): 60 mg via ORAL
  Filled 2020-08-16 (×3): qty 1

## 2020-08-16 MED ORDER — DIPHENHYDRAMINE HCL 50 MG/ML IJ SOLN: 50.0000 mg | Freq: Once | INTRAMUSCULAR | Status: DC

## 2020-08-16 NOTE — Progress Notes (Signed)
Subjective:  CC: Virginia Jackson is a 25 y.o. female  Hospital stay day 1,   right leg cellulitis  HPI: No acute issues overnight.  Erythema and swelling improved, as well as area of tenderness decrasing, but still present.  Scant serous drainage noted.  ROS:  General: Denies weight loss, weight gain, fatigue, fevers, chills, and night sweats. Heart: Denies chest pain, palpitations, racing heart, irregular heartbeat, leg pain or swelling, and decreased activity tolerance. Respiratory: Denies breathing difficulty, shortness of breath, wheezing, cough, and sputum. GI: Denies change in appetite, heartburn, nausea, vomiting, constipation, diarrhea, and blood in stool. GU: Denies difficulty urinating, pain with urinating, urgency, frequency, blood in urine.   Objective:   Temp:  [98.1 F (36.7 C)-98.5 F (36.9 C)] 98.2 F (36.8 C) (06/08 1151) Pulse Rate:  [79-100] 79 (06/08 1151) Resp:  [16-18] 16 (06/08 1151) BP: (107-127)/(61-74) 112/72 (06/08 1151) SpO2:  [99 %-100 %] 100 % (06/08 1151) Weight:  [83 kg] 83 kg (06/07 1959)     Height: 5\' 3"  (160 cm) Weight: 83 kg BMI (Calculated): 32.42   Intake/Output this shift:   Intake/Output Summary (Last 24 hours) at 08/16/2020 1349 Last data filed at 08/16/2020 1020 Gross per 24 hour  Intake 847.71 ml  Output --  Net 847.71 ml    Constitutional :  alert, cooperative, appears stated age and no distress  Respiratory:  clear to auscultation bilaterally  Cardiovascular:  regular rate and rhythm  Gastrointestinal: soft, non-tender; bowel sounds normal; no masses,  no organomegaly.   Skin: Cool and moist. Right medial thigh area with decreasing erythema and induration, but still present TTP at apex near groin.  Full ROM of RLE with minimal pain now.  No induration or pain noted in groin area.  Psychiatric: Normal affect, non-agitated, not confused       LABS:  CMP Latest Ref Rng & Units 08/16/2020 08/15/2020 06/21/2015  Glucose 70 - 99 mg/dL  08/21/2015) 93 91  BUN 6 - 20 mg/dL 12 16 19   Creatinine 0.44 - 1.00 mg/dL 053(Z 7.67  Sodium 135 - 145 mmol/L 135 139 139  Potassium 3.5 - 5.1 mmol/L 3.7 3.3(L) 4.0  Chloride 98 - 111 mmol/L 109 112(H) 110  CO2 22 - 32 mmol/L 20(L) 21(L) 21  Calcium 8.9 - 10.3 mg/dL 3.41) 8.9 9.6  Total Protein 6.4 - 8.6 g/dL - - -  Total Bilirubin 0.2 - 1.0 mg/dL - - -  Alkaline Phos Unit/L - - -  AST 0 - 26 Unit/L - - -  ALT 12 - 78 U/L - - -   CBC Latest Ref Rng & Units 08/16/2020 08/15/2020 05/13/2013  WBC 4.0 - 10.5 K/uL 18.6(H) 15.7(H) 9.6  Hemoglobin 12.0 - 15.0 g/dL 10/15/2020 07/13/2013 40.9  Hematocrit 36.0 - 46.0 % 39.8 39.4 44.4  Platelets 150 - 400 K/uL 262 252 254    RADS: CLINICAL DATA:  Right proximal inner thigh abscess. Pain extending towards the knee. Concern for deep soft tissue infection.  EXAM: CT OF THE LOWER RIGHT EXTREMITY WITH CONTRAST  TECHNIQUE: Multidetector CT imaging of the lower right extremity was performed according to the standard protocol following intravenous contrast administration.  CONTRAST:  73.5 OMNIPAQUE IOHEXOL 300 MG/ML  SOLN  COMPARISON:  None.  FINDINGS: Bones/Joint/Cartilage  No fracture. No erosion or bony destruction. Hip and knee alignment are maintained. No hip or knee joint effusion.  Ligaments  Suboptimally assessed by CT.  Muscles and Tendons  There is no intramuscular collection.  Fat stranding of the upper inner thigh extends to the superficial fascia adjacent to gracilis muscle but no deep intramuscular or compartmental inflammation. Normal fatty striations persist.  Soft tissues  Skin thickening with subcutaneous stranding in the upper inner thigh. Inflammatory change extend to gracilis muscle but no deep fascial extension. There is no peripherally enhancing or focal fluid collection. No soft tissue air. Proximal extent approaches the labia. Prominent right inguinal lymph nodes are typically reactive. There is an IUD in  the uterus.  IMPRESSION: 1. Skin thickening with subcutaneous stranding in the upper inner thigh consistent with cellulitis. No discrete collection. Inflammatory change extends to the superficial fascia adjacent to gracilis muscle but no deep fascial extension or intramuscular inflammation. No soft tissue air. 2. No acute osseous abnormality.   Electronically Signed   By: Narda Rutherford M.D.   On: 08/15/2020 22:02  Assessment:   Right leg cellulitis, improving clinically with IV vanc.  Will monitor leukocytosis for now since no indication of developing abscess, worsening infection at this time.  Resume home meds.

## 2020-08-17 LAB — CBC
HCT: 35.7 % — ABNORMAL LOW (ref 36.0–46.0)
Hemoglobin: 12.1 g/dL (ref 12.0–15.0)
MCH: 29.4 pg (ref 26.0–34.0)
MCHC: 33.9 g/dL (ref 30.0–36.0)
MCV: 86.7 fL (ref 80.0–100.0)
Platelets: 250 10*3/uL (ref 150–400)
RBC: 4.12 MIL/uL (ref 3.87–5.11)
RDW: 11.9 % (ref 11.5–15.5)
WBC: 9.2 10*3/uL (ref 4.0–10.5)
nRBC: 0 % (ref 0.0–0.2)

## 2020-08-17 LAB — BASIC METABOLIC PANEL
Anion gap: 6 (ref 5–15)
BUN: 12 mg/dL (ref 6–20)
CO2: 22 mmol/L (ref 22–32)
Calcium: 8.4 mg/dL — ABNORMAL LOW (ref 8.9–10.3)
Chloride: 110 mmol/L (ref 98–111)
Creatinine, Ser: 0.72 mg/dL (ref 0.44–1.00)
GFR, Estimated: >60 mL/min (ref >60)
Glucose, Bld: 86 mg/dL (ref 70–99)
Potassium: 3.4 mmol/L — ABNORMAL LOW (ref 3.5–5.1)
Sodium: 138 mmol/L (ref 135–145)

## 2020-08-17 LAB — PHOSPHORUS: Phosphorus: 4 mg/dL (ref 2.5–4.6)

## 2020-08-17 LAB — MAGNESIUM: Magnesium: 2 mg/dL (ref 1.7–2.4)

## 2020-08-17 MED ORDER — IBUPROFEN 800 MG PO TABS: 800.0000 mg | ORAL_TABLET | Freq: Three times a day (TID) | ORAL | 0 refills | Status: DC | PRN

## 2020-08-17 MED ORDER — SODIUM CHLORIDE 0.9 % IV SOLN
INTRAVENOUS | Status: DC | PRN
  Administered 2020-08-17: 250 mL via INTRAVENOUS

## 2020-08-17 MED ORDER — DOCUSATE SODIUM 100 MG PO CAPS: 100.0000 mg | ORAL_CAPSULE | Freq: Two times a day (BID) | ORAL | 0 refills | Status: AC | PRN

## 2020-08-17 MED ORDER — ACETAMINOPHEN 325 MG PO TABS: 650.0000 mg | ORAL_TABLET | Freq: Three times a day (TID) | ORAL | 0 refills | Status: AC | PRN

## 2020-08-17 MED ORDER — HYDROCODONE-ACETAMINOPHEN 5-325 MG PO TABS: 1.0000 | ORAL_TABLET | Freq: Four times a day (QID) | ORAL | 0 refills | Status: DC | PRN

## 2020-08-17 NOTE — Discharge Instructions (Addendum)
Call office on Monday to schedule followup appointment that day. Call or send mychart message through [facility] for any additional questions or concerns.  Ice pack to area as needed for discomfort  Keep packing intact until seen on office on Monday.  If outer gauze becomes saturated, remove and replace with clean gauze.   tylenol and advil as needed for discomfort.  Please alternate between the two every four hours as needed for pain.    Use narcotics, if prescribed, only when tylenol and motrin is not enough to control pain.  325-650mg  every 8hrs to max of 4000mg /24hrs (including the 325mg  in every norco dose) for the tylenol.    Advil up to 800mg  per dose every 8hrs as needed for pain.

## 2020-08-17 NOTE — Plan of Care (Signed)
  Problem: Education: Goal: Knowledge of General Education information will improve Description: Including pain rating scale, medication(s)/side effects and non-pharmacologic comfort measures Outcome: Progressing   Problem: Safety: Goal: Ability to remain free from injury will improve Outcome: Progressing   Problem: Skin Integrity: Goal: Risk for impaired skin integrity will decrease Outcome: Progressing   Problem: Pain Managment: Goal: General experience of comfort will improve Outcome: Progressing   

## 2020-08-18 ENCOUNTER — Encounter: Payer: Self-pay | Admitting: Surgery

## 2020-08-18 ENCOUNTER — Inpatient Hospital Stay: Payer: 59 | Admitting: Anesthesiology

## 2020-08-18 ENCOUNTER — Encounter
Admission: AD | Disposition: A | Discharge status: Home / Self Care | Payer: Self-pay | Source: Ambulatory Visit | Attending: Surgery

## 2020-08-18 HISTORY — PX: INCISION AND DRAINAGE ABSCESS: SHX5864

## 2020-08-18 LAB — CBC
HCT: 36.9 % (ref 36.0–46.0)
Hemoglobin: 12.5 g/dL (ref 12.0–15.0)
MCH: 29.3 pg (ref 26.0–34.0)
MCHC: 33.9 g/dL (ref 30.0–36.0)
MCV: 86.6 fL (ref 80.0–100.0)
Platelets: 271 10*3/uL (ref 150–400)
RBC: 4.26 MIL/uL (ref 3.87–5.11)
RDW: 11.8 % (ref 11.5–15.5)
WBC: 8 10*3/uL (ref 4.0–10.5)
nRBC: 0 % (ref 0.0–0.2)

## 2020-08-18 LAB — BASIC METABOLIC PANEL
Anion gap: 6 (ref 5–15)
BUN: 9 mg/dL (ref 6–20)
CO2: 24 mmol/L (ref 22–32)
Calcium: 8.7 mg/dL — ABNORMAL LOW (ref 8.9–10.3)
Chloride: 110 mmol/L (ref 98–111)
Creatinine, Ser: 0.67 mg/dL (ref 0.44–1.00)
GFR, Estimated: >60 mL/min (ref >60)
Glucose, Bld: 95 mg/dL (ref 70–99)
Potassium: 3.7 mmol/L (ref 3.5–5.1)
Sodium: 140 mmol/L (ref 135–145)

## 2020-08-18 LAB — PHOSPHORUS: Phosphorus: 3.9 mg/dL (ref 2.5–4.6)

## 2020-08-18 LAB — MAGNESIUM: Magnesium: 2.1 mg/dL (ref 1.7–2.4)

## 2020-08-18 SURGERY — INCISION AND DRAINAGE, ABSCESS
Anesthesia: General

## 2020-08-18 MED ORDER — LACTATED RINGERS IV SOLN: INTRAVENOUS | Status: DC

## 2020-08-18 MED ORDER — OXYCODONE HCL 5 MG/5ML PO SOLN
5.0000 mg | Freq: Once | ORAL | Status: DC | PRN
Start: 2020-08-18 — End: 2020-08-18

## 2020-08-18 MED ORDER — ONDANSETRON HCL 4 MG/2ML IJ SOLN
INTRAMUSCULAR | Status: DC | PRN
  Administered 2020-08-18: 4 mg via INTRAVENOUS

## 2020-08-18 MED ORDER — BUPIVACAINE-EPINEPHRINE (PF) 0.25% -1:200000 IJ SOLN
INTRAMUSCULAR | Status: AC
  Filled 2020-08-18: qty 30

## 2020-08-18 MED ORDER — OXYCODONE HCL 5 MG PO TABS: 5.0000 mg | ORAL_TABLET | Freq: Once | ORAL | Status: DC | PRN

## 2020-08-18 MED ORDER — CHLORHEXIDINE GLUCONATE 0.12 % MT SOLN
15.0000 mL | Freq: Once | OROMUCOSAL | Status: AC
  Administered 2020-08-18: 15 mL via OROMUCOSAL

## 2020-08-18 MED ORDER — BUPIVACAINE-EPINEPHRINE 0.25% -1:200000 IJ SOLN
INTRAMUSCULAR | Status: DC | PRN
  Administered 2020-08-18: 3 mL

## 2020-08-18 MED ORDER — MIDAZOLAM HCL 2 MG/2ML IJ SOLN
INTRAMUSCULAR | Status: DC | PRN
  Administered 2020-08-18: 2 mg via INTRAVENOUS

## 2020-08-18 MED ORDER — FENTANYL CITRATE (PF) 100 MCG/2ML IJ SOLN: 25.0000 ug | INTRAMUSCULAR | Status: DC | PRN

## 2020-08-18 MED ORDER — LIDOCAINE HCL (CARDIAC) PF 100 MG/5ML IV SOSY
PREFILLED_SYRINGE | INTRAVENOUS | Status: DC | PRN
  Administered 2020-08-18: 80 mg via INTRAVENOUS

## 2020-08-18 MED ORDER — HYDROCODONE-ACETAMINOPHEN 5-325 MG PO TABS
ORAL_TABLET | ORAL | Status: AC
  Filled 2020-08-18: qty 1

## 2020-08-18 MED ORDER — LIDOCAINE HCL 1 % IJ SOLN
INTRAMUSCULAR | Status: DC | PRN
  Administered 2020-08-18: 3 mL

## 2020-08-18 MED ORDER — FENTANYL CITRATE (PF) 100 MCG/2ML IJ SOLN
INTRAMUSCULAR | Status: DC | PRN
  Administered 2020-08-18 (×3): 25 ug via INTRAVENOUS

## 2020-08-18 MED ORDER — MIDAZOLAM HCL 2 MG/2ML IJ SOLN
INTRAMUSCULAR | Status: AC
  Filled 2020-08-18: qty 2

## 2020-08-18 MED ORDER — CHLORHEXIDINE GLUCONATE 0.12 % MT SOLN
OROMUCOSAL | Status: AC
  Filled 2020-08-18: qty 15

## 2020-08-18 MED ORDER — DEXAMETHASONE SODIUM PHOSPHATE 10 MG/ML IJ SOLN
INTRAMUSCULAR | Status: DC | PRN
  Administered 2020-08-18: 10 mg via INTRAVENOUS

## 2020-08-18 MED ORDER — LIDOCAINE HCL (PF) 2 % IJ SOLN
INTRAMUSCULAR | Status: AC
  Filled 2020-08-18: qty 5

## 2020-08-18 MED ORDER — PROPOFOL 10 MG/ML IV BOLUS
INTRAVENOUS | Status: AC
  Filled 2020-08-18: qty 20

## 2020-08-18 MED ORDER — LACTATED RINGERS IV SOLN: INTRAVENOUS | Status: DC | PRN

## 2020-08-18 MED ORDER — FENTANYL CITRATE (PF) 100 MCG/2ML IJ SOLN
INTRAMUSCULAR | Status: AC
  Filled 2020-08-18: qty 2

## 2020-08-18 MED ORDER — PROPOFOL 10 MG/ML IV BOLUS
INTRAVENOUS | Status: DC | PRN
  Administered 2020-08-18: 40 mg via INTRAVENOUS
  Administered 2020-08-18: 160 mg via INTRAVENOUS

## 2020-08-18 MED ORDER — LIDOCAINE HCL (PF) 1 % IJ SOLN
INTRAMUSCULAR | Status: AC
  Filled 2020-08-18: qty 30

## 2020-08-18 SURGICAL SUPPLY — 26 items
BLADE SURG 15 STRL LF DISP TIS (BLADE) ×1 IMPLANT
BLADE SURG 15 STRL SS (BLADE) ×2
CHLORAPREP W/TINT 26 (MISCELLANEOUS) IMPLANT
COVER WAND RF STERILE (DRAPES) ×3 IMPLANT
DRAPE LAPAROTOMY 100X77 ABD (DRAPES) ×3 IMPLANT
ELECT REM PT RETURN 9FT ADLT (ELECTROSURGICAL) ×3
ELECTRODE REM PT RTRN 9FT ADLT (ELECTROSURGICAL) ×1 IMPLANT
GAUZE PACKING IODOFORM 1/2 (PACKING) ×3 IMPLANT
GAUZE PACKING IODOFORM 1X5 (PACKING) ×3 IMPLANT
GAUZE SPONGE 4X4 12PLY STRL (GAUZE/BANDAGES/DRESSINGS) ×3 IMPLANT
GLOVE SURG SYN 6.5 ES PF (GLOVE) ×6 IMPLANT
GLOVE SURG UNDER POLY LF SZ7 (GLOVE) ×3 IMPLANT
GOWN STRL REUS W/ TWL LRG LVL3 (GOWN DISPOSABLE) ×2 IMPLANT
GOWN STRL REUS W/TWL LRG LVL3 (GOWN DISPOSABLE) ×4
MANIFOLD NEPTUNE II (INSTRUMENTS) ×3 IMPLANT
NEEDLE HYPO 22GX1.5 SAFETY (NEEDLE) ×3 IMPLANT
NS IRRIG 500ML POUR BTL (IV SOLUTION) ×3 IMPLANT
PACK BASIN MINOR ARMC (MISCELLANEOUS) ×3 IMPLANT
SUT MNCRL 4-0 (SUTURE)
SUT MNCRL 4-0 27XMFL (SUTURE)
SUT VIC AB 2-0 CT2 27 (SUTURE) IMPLANT
SUT VIC AB 3-0 SH 27 (SUTURE)
SUT VIC AB 3-0 SH 27X BRD (SUTURE) IMPLANT
SUTURE MNCRL 4-0 27XMF (SUTURE) IMPLANT
SYR 10ML LL (SYRINGE) ×3 IMPLANT
TOWEL OR 17X26 4PK STRL BLUE (TOWEL DISPOSABLE) ×3 IMPLANT

## 2020-08-18 NOTE — Anesthesia Procedure Notes (Signed)
Procedure Name: LMA Insertion Date/Time: 08/18/2020 5:49 PM Performed by: Henrietta Hoover, CRNA Pre-anesthesia Checklist: Patient identified, Patient being monitored, Timeout performed, Emergency Drugs available and Suction available Patient Re-evaluated:Patient Re-evaluated prior to induction Oxygen Delivery Method: Circle system utilized Preoxygenation: Pre-oxygenation with 100% oxygen Induction Type: IV induction Ventilation: Mask ventilation without difficulty LMA: LMA inserted LMA Size: 4.0 Tube type: Oral Number of attempts: 1 Placement Confirmation: positive ETCO2 and breath sounds checked- equal and bilateral Tube secured with: Tape Dental Injury: Teeth and Oropharynx as per pre-operative assessment

## 2020-08-18 NOTE — Transfer of Care (Signed)
Immediate Anesthesia Transfer of Care Note  Patient: Virginia Jackson  Procedure(s) Performed: INCISION AND DRAINAGE ABSCESS  Patient Location: PACU  Anesthesia Type:General  Level of Consciousness: awake, alert  and oriented  Airway & Oxygen Therapy: Patient Spontanous Breathing and Patient connected to face mask oxygen  Post-op Assessment: Report given to RN and Post -op Vital signs reviewed and stable  Post vital signs: Reviewed and stable  Last Vitals:  Vitals Value Taken Time  BP 123/78 08/18/20 1821  Temp    Pulse 100 08/18/20 1823  Resp 18 08/18/20 1823  SpO2 100 % 08/18/20 1823  Vitals shown include unvalidated device data.  Last Pain:  Vitals:   08/18/20 1550  TempSrc: Oral  PainSc: 0-No pain         Complications: No notable events documented.

## 2020-08-18 NOTE — Consult Note (Signed)
Pharmacy Antibiotic Note  Virginia Jackson is a 25 y.o. female admitted on 08/15/2020 with  right groin abscess . Per chart review, patient reports pain radiating down leg which is concerning for infectious spread to fascia layer. Currently awaiting imaging. General surgery admission. Pharmacy has been consulted for vancomycin dosing.  Plan:  Day 4- vancomycin 1 g IV q12h --Calculated AUC 523, Cmin 15.5 --Daily Scr per protocol --Levels at steady state as indicated  6/10: If continuing on Vanc will plan to check levels     Height: 5\' 3"  (160 cm) Weight: 83 kg (182 lb 15.7 oz) IBW/kg (Calculated) : 52.4  Temp (24hrs), Avg:98.1 F (36.7 C), Min:97.5 F (36.4 C), Max:99.4 F (37.4 C)  Recent Labs  Lab 08/15/20 1850 08/16/20 0530 08/17/20 0448 08/18/20 0353  WBC 15.7* 18.6* 9.2 8.0  CREATININE 0.97 0.88 0.72 0.67     Estimated Creatinine Clearance: 110.6 mL/min (by C-G formula based on SCr of 0.67 mg/dL).    No Known Allergies  Antimicrobials this admission: Vancomycin 6/7 >>   Dose adjustments this admission: N/A  Microbiology results: N/A  Thank you for allowing pharmacy to be a part of this patient's care.  Virginia Jackson A 08/18/2020 8:44 AM

## 2020-08-18 NOTE — Anesthesia Preprocedure Evaluation (Signed)
Anesthesia Evaluation  Patient identified by MRN, date of birth, ID band Patient awake    Reviewed: Allergy & Precautions, NPO status , Patient's Chart, lab work & pertinent test results  History of Anesthesia Complications Negative for: history of anesthetic complications  Airway Mallampati: II  TM Distance: >3 FB Neck ROM: full    Dental  (+) Chipped Braces:   Pulmonary neg pulmonary ROS, neg shortness of breath,    Pulmonary exam normal        Cardiovascular Exercise Tolerance: Good (-) angina(-) Past MI negative cardio ROS Normal cardiovascular exam     Neuro/Psych  Headaches, negative psych ROS   GI/Hepatic negative GI ROS, Neg liver ROS, neg GERD  ,  Endo/Other  negative endocrine ROS  Renal/GU      Musculoskeletal   Abdominal   Peds  Hematology negative hematology ROS (+)   Anesthesia Other Findings Past Medical History: No date: Anxiety No date: Degenerative disc disease, lumbar No date: Headache No date: Painful menstrual periods  History reviewed. No pertinent surgical history.  BMI    Body Mass Index: 31.00 kg/m      Reproductive/Obstetrics negative OB ROS                             Anesthesia Physical Anesthesia Plan  ASA: 2  Anesthesia Plan: General LMA   Post-op Pain Management:    Induction: Intravenous  PONV Risk Score and Plan: Dexamethasone, Ondansetron, Midazolam and Treatment may vary due to age or medical condition  Airway Management Planned: LMA  Additional Equipment:   Intra-op Plan:   Post-operative Plan: Extubation in OR  Informed Consent: I have reviewed the patients History and Physical, chart, labs and discussed the procedure including the risks, benefits and alternatives for the proposed anesthesia with the patient or authorized representative who has indicated his/her understanding and acceptance.     Dental Advisory Given  Plan  Discussed with: Anesthesiologist, CRNA and Surgeon  Anesthesia Plan Comments: (Patient consented for risks of anesthesia including but not limited to:  - adverse reactions to medications - damage to eyes, teeth, lips or other oral mucosa - nerve damage due to positioning  - sore throat or hoarseness - Damage to heart, brain, nerves, lungs, other parts of body or loss of life  Patient voiced understanding.)        Anesthesia Quick Evaluation

## 2020-08-18 NOTE — Progress Notes (Signed)
Subjective:  CC: Virginia Jackson is a 25 y.o. female  Hospital stay day 3,   right leg cellulitis  HPI: Continues to have pain in the area, but discharge has stopped.  Overall pain area is decreasing, and feeling better  ROS:  General: Denies weight loss, weight gain, fatigue, fevers, chills, and night sweats. Heart: Denies chest pain, palpitations, racing heart, irregular heartbeat, leg pain or swelling, and decreased activity tolerance. Respiratory: Denies breathing difficulty, shortness of breath, wheezing, cough, and sputum. GI: Denies change in appetite, heartburn, nausea, vomiting, constipation, diarrhea, and blood in stool. GU: Denies difficulty urinating, pain with urinating, urgency, frequency, blood in urine.   Objective:   Temp:  [97.5 F (36.4 C)-99.4 F (37.4 C)] 97.5 F (36.4 C) (06/10 0737) Pulse Rate:  [54-68] 63 (06/10 0737) Resp:  [16-20] 16 (06/09 2014) BP: (110-128)/(66-89) 128/76 (06/10 0737) SpO2:  [100 %] 100 % (06/10 0737)     Height: 5\' 3"  (160 cm) Weight: 83 kg BMI (Calculated): 32.42   Intake/Output this shift:   Intake/Output Summary (Last 24 hours) at 08/18/2020 10/18/2020 Last data filed at 08/18/2020 0600 Gross per 24 hour  Intake 322.82 ml  Output --  Net 322.82 ml    Constitutional :  alert, cooperative, appears stated age, and no distress  Respiratory:  clear to auscultation bilaterally  Cardiovascular:  regular rate and rhythm  Gastrointestinal: soft, non-tender; bowel sounds normal; no masses,  no organomegaly.   Skin: Cool and moist. Chaperone present for exam.  Right groin area erythema and induration improved, but persistent area exists are original location of drainage.  The area that was draining yesterday, has already healed and there is already fluctuance starting to build up again.  Psychiatric: Normal affect, non-agitated, not confused       LABS:  CMP Latest Ref Rng & Units 08/18/2020 08/17/2020 08/16/2020  Glucose 70 - 99 mg/dL 95 86  10/16/2020)  BUN 6 - 20 mg/dL 9 12 12   Creatinine 0.44 - 1.00 mg/dL 275(T 7.00  Sodium 135 - 145 mmol/L 140 138 135  Potassium 3.5 - 5.1 mmol/L 3.7 3.4(L) 3.7  Chloride 98 - 111 mmol/L 110 110 109  CO2 22 - 32 mmol/L 24 22 20(L)  Calcium 8.9 - 10.3 mg/dL 1.74) 9.44) 9.6(P)  Total Protein 6.4 - 8.6 g/dL - - -  Total Bilirubin 0.2 - 1.0 mg/dL - - -  Alkaline Phos Unit/L - - -  AST 0 - 26 Unit/L - - -  ALT 12 - 78 U/L - - -   CBC Latest Ref Rng & Units 08/18/2020 08/17/2020 08/16/2020  WBC 4.0 - 10.5 K/uL 8.0 9.2 18.6(H)  Hemoglobin 12.0 - 15.0 g/dL 10/17/2020 10/16/2020 46.6  Hematocrit 36.0 - 46.0 % 36.9 35.7(L) 39.8  Platelets 150 - 400 K/uL 271 250 262    RADS: N/a Assessment:   Right leg cellulitis, developed into localized abscess yesterday that was draining spontaneously.  Additional pressure at bedside released all of it, but now wound had healed and pain still persistent, with fluctuance redeveloping.  Discussed with patient this will likely become persistent unless we proceed with formal I&D.  She agrees to procedure.  To OR due to location.  1. Alternatives include continued observation.  Benefits include possible symptom relief, pathologic evaluation, improved cosmesis. Discussed the risk of surgery including recurrence, chronic pain, post-op infxn, poor cosmesis, poor/delayed wound healing, and possible re-operation to address said risks. The risks of general anesthetic, if used, includes MI,  CVA, sudden death or even reaction to anesthetic medications also discussed.  Typical post-op recovery time of 3-5 days with possible activity restrictions were also discussed.  The patient verbalized understanding and all questions were answered to the patient's satisfaction.

## 2020-08-18 NOTE — Progress Notes (Signed)
PT IV removed. Pt and family given d/c education, all questions answered. Pt dressing has some discoloration but has not come thru to the top layer yet. Pt given extra gauze and tape so she can change out the top dressing when strike thru noted/saturated. Pt aware of needing to keep the packing in place until follow up on Monday. Pt aware of scripts at pharmacy and when to take next. Family collected belongings and got pt dressed. Pt awaiting transport.

## 2020-08-19 ENCOUNTER — Encounter: Payer: Self-pay | Admitting: Surgery

## 2020-08-19 NOTE — Anesthesia Postprocedure Evaluation (Signed)
Anesthesia Post Note  Patient: Virginia Jackson  Procedure(s) Performed: INCISION AND DRAINAGE ABSCESS  Patient location during evaluation: PACU Anesthesia Type: General Level of consciousness: awake and alert Pain management: pain level controlled Vital Signs Assessment: post-procedure vital signs reviewed and stable Respiratory status: spontaneous breathing, nonlabored ventilation, respiratory function stable and patient connected to nasal cannula oxygen Cardiovascular status: blood pressure returned to baseline and stable Postop Assessment: no apparent nausea or vomiting Anesthetic complications: no   No notable events documented.   Last Vitals:  Vitals:   08/18/20 1830 08/18/20 1845  BP: 115/71 115/64  Pulse: 96 66  Resp: 15 16  Temp: 36.5 C (!) 36.3 C  SpO2: 100% 100%    Last Pain:  Vitals:   08/18/20 1845  TempSrc:   PainSc: 1                  Cleda Mccreedy Jerilee Space

## 2020-08-19 NOTE — Op Note (Signed)
Preoperative diagnosis: right leg abscess Postoperative diagnosis: same  Procedure: Incision and drainage of right leg abscess  Anesthesia: LMA  Surgeon: Tonna Boehringer  Wound Classification: Contaminated  Indications: Patient is a 25 y.o. female  presented with above.  See H&P for further details.  Specimen: none  Complications: None  Estimated Blood Loss: 38mL  Findings:  1. Right leg abscess, superficial 2. purulent secretions drained  3. Adequate hemostasis.   Description of procedure: The patient was placed in the supine frog-leg position and LMA anesthesia was induced. The area was prepped and draped in the usual sterile fashion. A timeout was completed verifying correct patient, procedure, site, positioning, and implant(s) and/or special equipment prior to beginning this procedure.  Local infused over planned incision site.  Inicision made and purulent secretions was drained. With a hemostat blunt dissection of septas performed to drain the abscess completely. No further tunneling noted deeper to the subcutaneous tissue.  Cavity was fully opened. The wound then irrigated, hemostasis confirmed and then packed with an iodine packing, dressed with abd pads and secured with paper tape.  The patient tolerated the procedure well and was taken to the postanesthesia care unit in satisfactory condition.

## 2020-08-19 NOTE — Discharge Summary (Signed)
Physician Discharge Summary  Patient ID: Virginia Jackson MRN: [id number] DOB/AGE: January 07, 1996 24 y.o.  Admit date: 08/15/2020 Discharge date: 08/18/20  Admission Diagnoses: right leg cellulitis  Discharge Diagnoses:  Right leg abscess  Discharged Condition: good  Hospital Course: admitted initially for concerns of right leg abscess, but subsequent CT showed only cellulitis, which initially improved with IV abx alone, but are matured into draining abscess, which required eventual I&D in OR due to persistent nature of it despite spontaneously draining when it first appeared.  Pt d/c'd after surgery with packing in place, close followup as outpt  Consults: None  Discharge Exam: Blood pressure 115/64, pulse 66, temperature (!) 97.4 F (36.3 C), resp. rate 16, height 5\' 3"  (1.6 m), weight 79.4 kg, SpO2 100 %. General appearance: alert, cooperative, and no distress Skin: Skin color, texture, turgor normal. No rashes or lesions or chaperone present for exam and I&D area dressing secure, no sign of active bleeding.  Disposition:  Discharge disposition: 01-Home or Self Care       Discharge Instructions     Discharge patient   Complete by: As directed    Discharge disposition: 01-Home or Self Care   Discharge patient date: 08/18/2020      Allergies as of 08/18/2020   No Known Allergies      Medication List     STOP taking these medications    eletriptan 40 MG tablet Commonly known as: RELPAX   metroNIDAZOLE 500 MG tablet Commonly known as: FLAGYL   traZODone 50 MG tablet Commonly known as: DESYREL       TAKE these medications    acetaminophen 325 MG tablet Commonly known as: Tylenol Take 2 tablets (650 mg total) by mouth every 8 (eight) hours as needed for mild pain.   ALPRAZolam 0.5 MG tablet Commonly known as: XANAX Take 1 tablet (0.5 mg total) by mouth 2 (two) times daily as needed for anxiety. Reported on 03/27/2015   baclofen 20 MG tablet Commonly known  as: LIORESAL Take 20 mg by mouth 2 (two) times daily.   butalbital-acetaminophen-caffeine 50-325-40 MG tablet Commonly known as: FIORICET Take 2 tablets by mouth every 6 (six) hours as needed.   docusate sodium 100 MG capsule Commonly known as: Colace Take 1 capsule (100 mg total) by mouth 2 (two) times daily as needed for up to 10 days for mild constipation.   fluvoxaMINE 50 MG tablet Commonly known as: LUVOX Take by mouth.   folic acid 1 MG tablet Commonly known as: FOLVITE Take 1 mg by mouth daily.   HYDROcodone-acetaminophen 5-325 MG tablet Commonly known as: Norco Take 1 tablet by mouth every 6 (six) hours as needed for up to 6 doses for moderate pain.   ibuprofen 800 MG tablet Commonly known as: ADVIL Take 1 tablet (800 mg total) by mouth every 8 (eight) hours as needed for mild pain or moderate pain. What changed:  medication strength how much to take when to take this reasons to take this additional instructions   Kyleena 19.5 MG IUD Generic drug: levonorgestrel by Intrauterine route once.   propranolol ER 60 MG 24 hr capsule Commonly known as: INDERAL LA Take 60 mg by mouth daily.   topiramate 50 MG tablet Commonly known as: TOPAMAX Take 1 tablet (50 mg total) by mouth daily.   verapamil 180 MG CR tablet Commonly known as: CALAN-SR Take 180 mg by mouth daily.        Follow-up Information     03/29/2015,  DO Follow up on 08/28/2020.   Specialty: Surgery Why: for leg abscess Contact information: [address] [place] [address] [phone]                  Total time spent arranging discharge was >10min. Signed: Sung Amabile 08/19/2020, 8:43 AM

## 2020-10-23 ENCOUNTER — Other Ambulatory Visit: Payer: Managed Care, Other (non HMO)

## 2021-01-10 ENCOUNTER — Ambulatory Visit: Payer: Self-pay | Admitting: Nurse Practitioner

## 2021-01-10 ENCOUNTER — Encounter: Payer: Self-pay | Admitting: Nurse Practitioner

## 2021-01-10 ENCOUNTER — Other Ambulatory Visit: Payer: Self-pay

## 2021-01-10 DIAGNOSIS — F419 Anxiety disorder, unspecified: Secondary | ICD-10-CM | POA: Insufficient documentation

## 2021-01-10 DIAGNOSIS — F339 Major depressive disorder, recurrent, unspecified: Secondary | ICD-10-CM | POA: Insufficient documentation

## 2021-01-10 DIAGNOSIS — Z113 Encounter for screening for infections with a predominantly sexual mode of transmission: Secondary | ICD-10-CM

## 2021-01-10 LAB — WET PREP FOR TRICH, YEAST, CLUE
Trichomonas Exam: NEGATIVE
Yeast Exam: NEGATIVE

## 2021-01-10 NOTE — Progress Notes (Signed)
[facility] Department STI clinic/screening visit  Subjective:  Virginia Jackson is a 25 y.o. female being seen today for an STI screening visit. The patient reports they do not have symptoms.  Patient reports that they do not desire a pregnancy in the next year.   They reported they are not interested in discussing contraception today.  Patient's last menstrual period was 01/07/2021.   Patient has the following medical conditions:   Patient Active Problem List   Diagnosis Date Noted   Anxiety 01/10/2021   Depression, recurrent (HCC) 01/10/2021   Abscess of right leg 08/15/2020   IUD (intrauterine device) in place 02/17/2020   Secondary amenorrhea 10/25/2015   Family history of ovarian cancer 10/25/2015   Acne 01/04/2015   Migraine without aura and without status migrainosus, not intractable 12/14/2014    Chief Complaint  Patient presents with   SEXUALLY TRANSMITTED DISEASE    Screening    HPI  Patient reports to clinic today for a routine STD screening.  Patient states she is asymptomatic.  No complaints.   Last HIV test per patient/review of record was 08/15/2020 Patient reports last pap was 02/25/2018.   See flowsheet for further details and programmatic requirements.    The following portions of the patient's history were reviewed and updated as appropriate: allergies, current medications, past medical history, past social history, past surgical history and problem list.  Objective:  There were no vitals filed for this visit.  Physical Exam Constitutional:      Appearance: Normal appearance.  HENT:     Head: Normocephalic and atraumatic.  Pulmonary:     Effort: Pulmonary effort is normal.  Abdominal:     General: Abdomen is flat.     Palpations: Abdomen is soft.  Genitourinary:    General: Normal vulva.     Rectum: Normal.     Comments: External genitalia/pubic area without nits, lice, edema, erythema, lesions and inguinal adenopathy. Vagina with  normal mucosa and discharge. PH >4.5 Cervix without visible lesions.  IUD strings visible. Uterus firm, mobile, nt, no masses, no CMT, no adnexal tenderness or fullness.  Musculoskeletal:     Cervical back: Normal range of motion and neck supple.  Lymphadenopathy:     Cervical: No cervical adenopathy.  Skin:    General: Skin is warm and dry.  Neurological:     Mental Status: She is alert and oriented to person, place, and time.  Psychiatric:        Mood and Affect: Mood normal.        Behavior: Behavior normal.     Assessment and Plan:  Virginia Jackson is a 25 y.o. female presenting to the [facility] Department for STI screening  1. Screening examination for venereal disease Patient accepted all screenings including oral and vaginal CT/GC.  Patient declined bloodwork for HIV/RPR.  Patient meets criteria for HepB screening? No. Ordered? No - does not meet  criteria.  Patient meets criteria for HepC screening? No. Ordered? No - does not meet criteria.    Treat wet prep per standing order Discussed time line for [facility] results and that patient will be called with positive results and encouraged patient to call if she had not heard in 2 weeks.  Counseled to return or seek care for continued or worsening symptoms Recommended condom use with all sex  Patient is currently using  Kyleena/IUD  to prevent pregnancy.   - WET PREP FOR TRICH, YEAST, CLUE - Gonococcus culture -  Chlamydia/Gonorrhea [facility]    No follow-ups on file.  Future Appointments  Date Time Provider Department Center  02/06/2021  8:50 AM Drema Dallas, DO [facility] None  03/26/2021  1:30 PM Doreene Burke, CNM [facility] None    Glenna Fellows, FNP

## 2021-01-14 LAB — GONOCOCCUS CULTURE

## 2021-02-05 NOTE — Progress Notes (Signed)
NEUROLOGY FOLLOW UP OFFICE NOTE  Virginia Jackson [id number]  Assessment/Plan:   Migraine without aura, without status migrainosus, not intractable - increased frequency.  Migraine prevention:  Increase topiramate to 100mg  at bedtime (side effects, including teratogenic effects, discussed); due to likely low efficacy and her low blood pressure, discontinue verapamil CR 180mg  daily Will need to taper off of Fioricet by one day every week until completely off.  If this is not tolerable, will bridge with phenobarbital. Migraine rescue:  She will try Ubrelvy.  Stop Relpax. Limit use of pain relievers to no more than 2 days out of week to prevent risk of rebound or medication-overuse headache. Keep headache diary Follow up 6 months.   Subjective:  Virginia Jackson is a 25 year old right-handed female who follows up for migraines.   UPDATE: Increased migraines since summer.  Increased stress possible trigger.  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  In June, she required surgery for abscess that turned into cellulitis in her right leg.  Relpax no longer working.  PCP started her on Fioricet which is effective and she has been taking almost daily. Intensity:  moderate to severe Duration:  all day with Relpax, within 30 minutes with Fioricet Frequency:  1 to 2 days a week  Current NSAIDS:  none Current analgesics: Fioricet Current triptans: Relpax 40 mg Current ergotamine: None Current anti-emetic: None Current muscle relaxants: None Current anti-anxiolytic: Xanax Current sleep aide: None Current Antihypertensive medications:  verapamil CR 180mg  Current Antidepressant medications: Paroxetine Current Anticonvulsant medications: Topiramate 50 mg at bedtime Current anti-CGRP: None Current Vitamins/Herbal/Supplements: Folic acid Current Antihistamines/Decongestants: None Other therapy: clonazepam, buspirone Hormone/birth control: IUD  (no change)   Caffeine: No Diet: Eats healthy.  Drinks plenty water. Exercise: Routine Depression: No; Anxiety: Yes Other pain: No Sleep hygiene: Good   HISTORY: Onset:  Childhood Location:  Varies (frontal, either side) Quality:  pounding Initial Intensity:  10/10 Aura:  Sometimes sees spots in her vision prior to headache onset Prodrome:  on Associated symptoms: Nausea, photophobia, phonophobia;  Vomiting when severe. Initial Duration:  Until she falls asleep.  Severe episodes last 1 to 3 days Initial Frequency:  Daily.  Severe episodes occur once a month to once every other month. Triggers/aggravating factors:  movement, stress Relieving factors:  Sleep, vomiting, hot shower Activity:  Cannot function when severe (once a month to once every other month)   Past NSAIDS: Advil, Advil migraine Past analgesics: Fioricet Past abortive triptans: Maxalt, sumatriptan tablet, Zomig 5 mg NS, Zomig 5 mg tablet Past abortive ergotamine:  none Past muscle relaxants:  none Past anti-emetic:  none Past antihypertensive medications: propranolol ER 60mg  Past antidepressant medications:  none Past anticonvulsant medications:  none Past anti-CGRP:  none Past vitamins/Herbal/Supplements:  none Past antihistamines/decongestants:  none Other past therapies:  none   Family history of headache:  mom  PAST MEDICAL HISTORY: Past Medical History:  Diagnosis Date   Anxiety    Degenerative disc disease, lumbar    Depression    Headache    Painful menstrual periods     MEDICATIONS: Current Outpatient Medications on File Prior to Visit  Medication Sig Dispense Refill   ALPRAZolam (XANAX) 0.5 MG tablet Take 1 tablet (0.5 mg total) by mouth 2 (two) times daily as needed for anxiety. Reported on 03/27/2015 (Patient not taking: Reported on 01/10/2021) 60 tablet 2   baclofen (LIORESAL) 20 MG tablet Take 20 mg by mouth 2 (two) times  daily.     busPIRone (BUSPAR) 10 MG tablet Take 10 mg by mouth 2  (two) times daily.     butalbital-acetaminophen-caffeine (FIORICET) 50-325-40 MG tablet Take 2 tablets by mouth every 6 (six) hours as needed. (Patient not taking: Reported on 01/10/2021)     clonazePAM (KLONOPIN) 1 MG tablet Take 1 mg by mouth 2 (two) times daily.     fluvoxaMINE (LUVOX) 50 MG tablet Take by mouth.     folic acid (FOLVITE) 1 MG tablet Take 1 mg by mouth daily.     HYDROcodone-acetaminophen (NORCO) 5-325 MG tablet Take 1 tablet by mouth every 6 (six) hours as needed for up to 6 doses for moderate pain. (Patient not taking: Reported on 01/10/2021) 6 tablet 0   ibuprofen (ADVIL) 800 MG tablet Take 1 tablet (800 mg total) by mouth every 8 (eight) hours as needed for mild pain or moderate pain. 30 tablet 0   levonorgestrel (KYLEENA) 19.5 MG IUD by Intrauterine route once.     magnesium oxide (MAG-OX) 400 MG tablet Take 400 mg by mouth 2 (two) times daily.     PARoxetine (PAXIL) 20 MG tablet Take 20 mg by mouth daily.     propranolol ER (INDERAL LA) 60 MG 24 hr capsule Take 60 mg by mouth daily.     topiramate (TOPAMAX) 50 MG tablet Take 1 tablet (50 mg total) by mouth daily. 90 tablet 3   verapamil (CALAN-SR) 180 MG CR tablet Take 180 mg by mouth daily.     Current Facility-Administered Medications on File Prior to Visit  Medication Dose Route Frequency Provider Last Rate Last Admin   Levonorgestrel IUD 1 Device  1 Device Intrauterine Once [place], Melody N, CNM        ALLERGIES: No Known Allergies  FAMILY HISTORY: Family History  Problem Relation Age of Onset   Hypertension Maternal Grandmother    Hypertension Maternal Grandfather    Cancer Maternal Grandfather        lymphoma    Cancer Paternal Grandmother        ovarian    Aneurysm Paternal Grandfather    Rheum arthritis Mother    Hypertension Mother       Objective:  Blood pressure (!) 100/56, pulse 80, height 5' (1.524 m), weight 203 lb 12.8 oz (92.4 kg), last menstrual period 01/07/2021, SpO2 100 %. General:  No acute distress.  Patient appears well-groomed.   Head:  Normocephalic/atraumatic Eyes:  Fundi examined but not visualized Neck: supple, no paraspinal tenderness, full range of motion Heart:  Regular rate and rhythm Lungs:  Clear to auscultation bilaterally Back: No paraspinal tenderness Neurological Exam: alert and oriented to person, place, and time.  Speech fluent and not dysarthric, language intact.  CN II-XII intact. Bulk and tone normal, muscle strength 5/5 throughout.  Sensation to light touch intact.  Deep tendon reflexes 2+ throughout, toes downgoing.  Finger to nose testing intact.  Gait normal, Romberg negative.   [name], DO  CC: [name], MD

## 2021-02-06 ENCOUNTER — Other Ambulatory Visit: Payer: Self-pay

## 2021-02-06 ENCOUNTER — Encounter: Payer: Self-pay | Admitting: Neurology

## 2021-02-06 ENCOUNTER — Ambulatory Visit: Payer: Managed Care, Other (non HMO) | Admitting: Neurology

## 2021-02-06 VITALS — BP 100/56 | HR 80 | Ht 60.0 in | Wt 203.8 lb

## 2021-02-06 DIAGNOSIS — G43009 Migraine without aura, not intractable, without status migrainosus: Secondary | ICD-10-CM

## 2021-02-06 MED ORDER — TOPIRAMATE 100 MG PO TABS: 100.0000 mg | ORAL_TABLET | Freq: Every day | ORAL | 5 refills | Status: DC

## 2021-02-06 NOTE — Patient Instructions (Signed)
Increase topiramate to 100mg  at bedtime At earliest onset of migraine, take Ubrelvy.  May repeat in 2 hours (maximum 2 tablets in 24 hours).  If effective, contact me We must taper off of the butalbital-caffeine-acetaminophen: Take no more than 6 days week 1 Take no more than 5 days week 2 Take no more than 4 days week 3 Take no more than 3 days week 4 Take no more than 2 days week 5 Take no more than 1 day week 6 Then stop If you cannot tolerate this, then we can add a medication called phenobarbital to take while trying to get off of this medication to help make it easier 4.  Follow up 6 months.

## 2021-02-22 ENCOUNTER — Encounter: Payer: BC Managed Care – PPO | Admitting: Certified Nurse Midwife

## 2021-02-23 ENCOUNTER — Other Ambulatory Visit: Payer: Self-pay | Admitting: Neurology

## 2021-02-23 ENCOUNTER — Encounter: Payer: Self-pay | Admitting: Neurology

## 2021-02-23 MED ORDER — UBRELVY 100 MG PO TABS: 1.0000 | ORAL_TABLET | ORAL | 5 refills | Status: DC | PRN

## 2021-02-28 ENCOUNTER — Telehealth: Payer: Self-pay

## 2021-02-28 NOTE — Telephone Encounter (Signed)
New message   [employer] is reviewing your PA request and will respond within 24 hours for Medicaid or up to 72 hours for non-Medicaid plans, based on the required timeframe determined by state or federal regulations. To check for an update later, open this request from your dashboard.  Virginia Jackson Key: [id number] - PA Case ID: [id number] - Rx #: [id number] Need help? Call us at [phone] Status Sent to [facility] Ubrelvy 100MG  tablets Form [employer] Electronic PA Form [phone] NCPDP)

## 2021-03-09 NOTE — Telephone Encounter (Signed)
Virginia Jackson Key: [id number] - PA Case ID: [id number] - Rx #: [id number] Need help? Call us at [phone] Outcome Approvedon December 29 CaseId:[id number];Status:Approved;Review Type:Prior Auth;Coverage Start Date:02/28/2021;Coverage End Date:03/08/2022; Drug Bernita Raisin 100MG  tablets Form [employer] Electronic PA Form [phone] NCPDP)

## 2021-03-26 ENCOUNTER — Other Ambulatory Visit: Payer: Self-pay

## 2021-03-26 ENCOUNTER — Other Ambulatory Visit (HOSPITAL_COMMUNITY)
Admission: RE | Admit: 2021-03-26 | Discharge: 2021-03-26 | Disposition: A | Discharge status: Home / Self Care | Payer: Managed Care, Other (non HMO) | Source: Ambulatory Visit | Attending: Certified Nurse Midwife | Admitting: Certified Nurse Midwife

## 2021-03-26 ENCOUNTER — Ambulatory Visit (INDEPENDENT_AMBULATORY_CARE_PROVIDER_SITE_OTHER): Payer: Managed Care, Other (non HMO) | Admitting: Certified Nurse Midwife

## 2021-03-26 ENCOUNTER — Encounter: Payer: Self-pay | Admitting: Certified Nurse Midwife

## 2021-03-26 VITALS — BP 123/79 | HR 91 | Ht 63.0 in | Wt 196.4 lb

## 2021-03-26 DIAGNOSIS — Z124 Encounter for screening for malignant neoplasm of cervix: Secondary | ICD-10-CM

## 2021-03-26 DIAGNOSIS — Z01419 Encounter for gynecological examination (general) (routine) without abnormal findings: Secondary | ICD-10-CM | POA: Insufficient documentation

## 2021-03-26 DIAGNOSIS — Z113 Encounter for screening for infections with a predominantly sexual mode of transmission: Secondary | ICD-10-CM

## 2021-03-26 NOTE — Progress Notes (Signed)
GYNECOLOGY ANNUAL PREVENTATIVE CARE ENCOUNTER NOTE  History:     Virginia Jackson is a 26 y.o. G0P0000 female here for a routine annual gynecologic exam.  Current complaints:none .   Denies abnormal vaginal bleeding, discharge, pelvic pain, problems with intercourse or other gynecologic concerns.     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 Contraception: IUD Last Pap: 02/25/2018. Results were: normal  Last mammogram: n/a .    The pregnancy intention screening data noted above was reviewed. Potential methods of contraception were discussed. The patient elected to proceed with IUD.  Obstetric History OB History  Gravida Para Term Preterm AB Living  0 0 0 0 0 0  SAB IAB Ectopic Multiple Live Births  0 0 0 0      Past Medical History:  Diagnosis Date   Anxiety    Degenerative disc disease, lumbar    Depression    Headache    Painful menstrual periods     Past Surgical History:  Procedure Laterality Date   INCISION AND DRAINAGE ABSCESS N/A 08/18/2020   Procedure: INCISION AND DRAINAGE ABSCESS;  Surgeon: Sung Amabile, DO;  Location: [facility] ORS;  Service: General;  Laterality: N/A;    Current Outpatient Medications on File Prior to Visit  Medication Sig Dispense Refill   levonorgestrel (KYLEENA) 19.5 MG IUD by Intrauterine route once.     ALPRAZolam (XANAX) 0.5 MG tablet Take 1 tablet (0.5 mg total) by mouth 2 (two) times daily as needed for anxiety. Reported on 03/27/2015 (Patient not taking: Reported on 01/10/2021) 60 tablet 2   baclofen (LIORESAL) 20 MG tablet Take 20 mg by mouth 2 (two) times daily.     busPIRone (BUSPAR) 10 MG tablet Take 10 mg by mouth 2 (two) times daily.     butalbital-acetaminophen-caffeine (FIORICET) 50-325-40 MG tablet Take 2 tablets by mouth every 6 (six)  hours as needed. (Patient not taking: Reported on 01/10/2021)     clonazePAM (KLONOPIN) 1 MG tablet Take 1 mg by mouth 2 (two) times daily.     fluvoxaMINE (LUVOX) 50 MG tablet Take by mouth.     folic acid (FOLVITE) 1 MG tablet Take 1 mg by mouth daily.     HYDROcodone-acetaminophen (NORCO) 5-325 MG tablet Take 1 tablet by mouth every 6 (six) hours as needed for up to 6 doses for moderate pain. (Patient not taking: Reported on 01/10/2021) 6 tablet 0   ibuprofen (ADVIL) 800 MG tablet Take 1 tablet (800 mg total) by mouth every 8 (eight) hours as needed for mild pain or moderate pain. 30 tablet 0   magnesium oxide (MAG-OX) 400 MG tablet Take 400 mg by mouth 2 (two) times daily.     PARoxetine (PAXIL) 20 MG tablet Take 20 mg by mouth daily. (Patient not taking: Reported on 03/26/2021)     propranolol ER (INDERAL LA) 60 MG 24 hr capsule Take 60 mg by mouth daily. (Patient not taking: Reported on 03/26/2021)     topiramate (TOPAMAX) 100 MG tablet Take 1 tablet (100 mg total) by mouth at bedtime. (Patient not taking: Reported on 03/26/2021) 30 tablet 5   topiramate (TOPAMAX) 50 MG tablet Take 1 tablet (50 mg total) by mouth daily. (Patient not taking: Reported on 03/26/2021) 90 tablet 3   Ubrogepant (UBRELVY) 100 MG TABS Take 1 tablet by mouth as needed (  May repeat after 2 hours.  Maximum 2 tablets in 24 hours.). (Patient not taking: Reported on 03/26/2021) 16 tablet 5   Current Facility-Administered Medications on File Prior to Visit  Medication Dose Route Frequency Provider Last Rate Last Admin   Levonorgestrel IUD 1 Device  1 Device Intrauterine Once [place], Melody N, CNM        No Known Allergies  Social History:  reports that she has never smoked. She has never used smokeless tobacco. She reports current alcohol use. She reports that she does not use drugs.  Family History  Problem Relation Age of Onset   Hypertension Maternal Grandmother    Hypertension Maternal Grandfather    Cancer Maternal  Grandfather        lymphoma    Cancer Paternal Grandmother        ovarian    Aneurysm Paternal Grandfather    Rheum arthritis Mother    Hypertension Mother     The following portions of the patient's history were reviewed and updated as appropriate: allergies, current medications, past family history, past medical history, past social history, past surgical history and problem list.  Review of Systems Pertinent items noted in HPI and remainder of comprehensive ROS otherwise negative.  Physical Exam:  BP 123/79    Pulse 91    Ht 5\' 3"  (1.6 m)    Wt 196 lb 6.4 oz (89.1 kg)    LMP 03/24/2021 (Exact Date)    BMI 34.79 kg/m  CONSTITUTIONAL: Well-developed, well-nourished female in no acute distress.  HENT:  Normocephalic, atraumatic, External right and left ear normal. Oropharynx is clear and moist EYES: Conjunctivae and EOM are normal. Pupils are equal, round, and reactive to light. No scleral icterus.  NECK: Normal range of motion, supple, no masses.  Normal thyroid.  SKIN: Skin is warm and dry. No rash noted. Not diaphoretic. No erythema. No pallor. MUSCULOSKELETAL: Normal range of motion. No tenderness.  No cyanosis, clubbing, or edema.  2+ distal pulses. NEUROLOGIC: Alert and oriented to person, place, and time. Normal reflexes, muscle tone coordination.  PSYCHIATRIC: Normal mood and affect. Normal behavior. Normal judgment and thought content. CARDIOVASCULAR: Normal heart rate noted, regular rhythm RESPIRATORY: Clear to auscultation bilaterally. Effort and breath sounds normal, no problems with respiration noted. BREASTS: Symmetric in size. No masses, tenderness, skin changes, nipple drainage, or lymphadenopathy bilaterally.  ABDOMEN: Soft, no distention noted.  No tenderness, rebound or guarding.  PELVIC: Normal appearing external genitalia and urethral meatus; normal appearing vaginal mucosa and cervix.  No abnormal discharge noted.  Pap smear obtained. Strings present  Normal uterine  size, no other palpable masses, no uterine or adnexal tenderness.  .   Assessment and Plan:    1. Well woman exam with routine gynecological exam    Pap:Will follow up results of pap smear and manage accordingly.  Mammogram : n/a  Labs: vaginal swab Refills: none Referral: none Routine preventative health maintenance measures emphasized. Please refer to After Visit Summary for other counseling recommendations.      03/26/2021, CNM [facility],  [facility]

## 2021-03-26 NOTE — Patient Instructions (Signed)
Preventive Care 21-26 Years Old, Female °Preventive care refers to lifestyle choices and visits with your health care provider that can promote health and wellness. Preventive care visits are also called wellness exams. °What can I expect for my preventive care visit? °Counseling °During your preventive care visit, your health care provider may ask about your: °Medical history, including: °Past medical problems. °Family medical history. °Pregnancy history. °Current health, including: °Menstrual cycle. °Method of birth control. °Emotional well-being. °Home life and relationship well-being. °Sexual activity and sexual health. °Lifestyle, including: °Alcohol, nicotine or tobacco, and drug use. °Access to firearms. °Diet, exercise, and sleep habits. °Work and work environment. °Sunscreen use. °Safety issues such as seatbelt and bike helmet use. °Physical exam °Your health care provider may check your: °Height and weight. These may be used to calculate your BMI (body mass index). BMI is a measurement that tells if you are at a healthy weight. °Waist circumference. This measures the distance around your waistline. This measurement also tells if you are at a healthy weight and may help predict your risk of certain diseases, such as type 2 diabetes and high blood pressure. °Heart rate and blood pressure. °Body temperature. °Skin for abnormal spots. °What immunizations do I need? °Vaccines are usually given at various ages, according to a schedule. Your health care provider will recommend vaccines for you based on your age, medical history, and lifestyle or other factors, such as travel or where you work. °What tests do I need? °Screening °Your health care provider may recommend screening tests for certain conditions. This may include: °Pelvic exam and Pap test. °Lipid and cholesterol levels. °Diabetes screening. This is done by checking your blood sugar (glucose) after you have not eaten for a while (fasting). °Hepatitis B  test. °Hepatitis C test. °HIV (human immunodeficiency virus) test. °STI (sexually transmitted infection) testing, if you are at risk. °BRCA-related cancer screening. This may be done if you have a family history of breast, ovarian, tubal, or peritoneal cancers. °Talk with your health care provider about your test results, treatment options, and if necessary, the need for more tests. °Follow these instructions at home: °Eating and drinking ° °Eat a healthy diet that includes fresh fruits and vegetables, whole grains, lean protein, and low-fat dairy products. °Take vitamin and mineral supplements as recommended by your health care provider. °Do not drink alcohol if: °Your health care provider tells you not to drink. °You are pregnant, may be pregnant, or are planning to become pregnant. °If you drink alcohol: °Limit how much you have to 0-1 drink a day. °Know how much alcohol is in your drink. In the [place] one drink equals one 12 oz bottle of beer (355 mL), one 5 oz [place] of wine (148 mL), or one 1½ oz [place] of hard liquor (44 mL). °Lifestyle °Brush your teeth every morning and night with fluoride toothpaste. Floss one time each day. °Exercise for at least 30 minutes 5 or more days each week. °Do not use any products that contain nicotine or tobacco. These products include cigarettes, chewing tobacco, and vaping devices, such as e-cigarettes. If you need help quitting, ask your health care provider. °Do not use drugs. °If you are sexually active, practice safe sex. Use a condom or other form of protection to prevent STIs. °If you do not wish to become pregnant, use a form of birth control. If you plan to become pregnant, see your health care provider for a prepregnancy visit. °Find healthy ways to manage stress, such as: °Meditation, yoga,   or listening to music. °Journaling. °Talking to a trusted person. °Spending time with friends and family. °Minimize exposure to UV radiation to reduce your risk of skin  cancer. °Safety °Always wear your seat belt while driving or riding in a vehicle. °Do not drive: °If you have been drinking alcohol. Do not ride with someone who has been drinking. °If you have been using any mind-altering substances or drugs. °While texting. °When you are tired or distracted. °Wear a helmet and other protective equipment during sports activities. °If you have firearms in your house, make sure you follow all gun safety procedures. °Seek help if you have been physically or sexually abused. °What's next? °Go to your health care provider once a year for an annual wellness visit. °Ask your health care provider how often you should have your eyes and teeth checked. °Stay up to date on all vaccines. °This information is not intended to replace advice given to you by your health care provider. Make sure you discuss any questions you have with your health care provider. °Document Revised: 08/23/2020 Document Reviewed: 08/23/2020 °Elsevier Patient Education © 2022 [employer] ° °

## 2021-03-28 ENCOUNTER — Encounter: Payer: Self-pay | Admitting: Certified Nurse Midwife

## 2021-03-28 ENCOUNTER — Other Ambulatory Visit: Payer: Self-pay | Admitting: Certified Nurse Midwife

## 2021-03-28 LAB — CERVICOVAGINAL ANCILLARY ONLY
Bacterial Vaginitis (gardnerella): POSITIVE — AB
Candida Glabrata: NEGATIVE
Candida Vaginitis: NEGATIVE
Chlamydia: NEGATIVE
Comment: NEGATIVE
Comment: NEGATIVE
Comment: NEGATIVE
Comment: NEGATIVE
Comment: NEGATIVE
Comment: NORMAL
Neisseria Gonorrhea: NEGATIVE
Trichomonas: NEGATIVE

## 2021-03-28 LAB — CYTOLOGY - PAP: Diagnosis: NEGATIVE

## 2021-03-28 MED ORDER — METRONIDAZOLE 500 MG PO TABS: 500.0000 mg | ORAL_TABLET | Freq: Two times a day (BID) | ORAL | 0 refills | Status: AC

## 2021-05-17 ENCOUNTER — Telehealth: Payer: Self-pay

## 2021-05-17 ENCOUNTER — Encounter: Payer: Self-pay | Admitting: Neurology

## 2021-05-17 MED ORDER — ELETRIPTAN HYDROBROMIDE 40 MG PO TABS: 40.0000 mg | ORAL_TABLET | ORAL | 0 refills | Status: DC | PRN

## 2021-05-17 MED ORDER — AIMOVIG 140 MG/ML ~~LOC~~ SOAJ: 140.0000 mg | SUBCUTANEOUS | 0 refills | Status: DC

## 2021-05-17 NOTE — Telephone Encounter (Signed)
Per Dr.Jaffe,  ?1. If migraines are still frequent despite increasing topiramate to 100mg  at bedtime, then I would start a CGRP inhibitor, one of the auto-injectors that she will self-administer every 28 days.  If agreeable, then we send script for Aimovig 140mg  every 28 days.  ?2.  Ok to refill Relpax  ?3   Make sure that she is no longer taking the butalbital-acetaminophen-caffeine  ? ?Aimovig 140 mg and Relpax sent to the [facility].  ?

## 2021-06-11 ENCOUNTER — Ambulatory Visit: Payer: Managed Care, Other (non HMO) | Admitting: Nurse Practitioner

## 2021-06-11 ENCOUNTER — Encounter: Payer: Self-pay | Admitting: Nurse Practitioner

## 2021-06-11 ENCOUNTER — Ambulatory Visit: Payer: Self-pay

## 2021-06-11 DIAGNOSIS — Z113 Encounter for screening for infections with a predominantly sexual mode of transmission: Secondary | ICD-10-CM

## 2021-06-11 LAB — WET PREP FOR TRICH, YEAST, CLUE
Trichomonas Exam: NEGATIVE
Yeast Exam: NEGATIVE

## 2021-06-11 LAB — HM HIV SCREENING LAB: HM HIV Screening: NEGATIVE

## 2021-06-11 NOTE — Progress Notes (Signed)
[facility] Department ? ?STI clinic/screening visit ?[address] ?[place] [address] ?[phone] ? ?Subjective:  ?Virginia Jackson is a 26 y.o. female being seen today for an STI screening visit. The patient reports they do not have symptoms.  Patient reports that they do not desire a pregnancy in the next year.   They reported they are not interested in discussing contraception today.  Currently has IUD in place.  ? ?No LMP recorded. (Menstrual status: IUD). ? ? ?Patient has the following medical conditions:   ?Patient Active Problem List  ? Diagnosis Date Noted  ? Anxiety 01/10/2021  ? Depression, recurrent (HCC) 01/10/2021  ? Abscess of right leg 08/15/2020  ? IUD (intrauterine device) in place 02/17/2020  ? Secondary amenorrhea 10/25/2015  ? Family history of ovarian cancer 10/25/2015  ? Acne 01/04/2015  ? Migraine without aura and without status migrainosus, not intractable 12/14/2014  ? ? ?Chief Complaint  ?Patient presents with  ? SEXUALLY TRANSMITTED DISEASE  ?  Screen for STI  ? ? ?HPI ? ?Patient reports to clinic today for STD screening.   ? ?Last HIV test per patient/review of record was 08/15/2020 ?Patient reports last pap was 03/2021.  ? ?Screening for MPX risk: ?Does the patient have an unexplained rash? No ?Is the patient MSM? No ?Does the patient endorse multiple sex partners or anonymous sex partners? No ?Did the patient have close or sexual contact with a person diagnosed with MPX? No ?Has the patient traveled outside the [place] where MPX is endemic? No ?Is there a high clinical suspicion for MPX-- evidenced by one of the following No ? -Unlikely to be chickenpox ? -Lymphadenopathy ? -Rash that present in same phase of evolution on any given body part ?See flowsheet for further details and programmatic requirements.  ? ? ?The following portions of the patient's history were reviewed and updated as appropriate: allergies, current medications, past medical history, past social  history, past surgical history and problem list. ? ?Objective:  ?There were no vitals filed for this visit. ? ?Physical Exam ?Constitutional:   ?   Appearance: Normal appearance.  ?HENT:  ?   Head: Normocephalic.  ?   Right Ear: External ear normal.  ?   Left Ear: External ear normal.  ?   Nose: Nose normal.  ?   Mouth/Throat:  ?   Lips: Pink.  ?   Comments: No visible dental caries.  ?Pulmonary:  ?   Effort: Pulmonary effort is normal.  ?Abdominal:  ?   General: Abdomen is flat.  ?   Palpations: Abdomen is soft.  ?Genitourinary: ?   Comments: External genitalia/pubic area without nits, lice, edema, erythema, lesions and inguinal adenopathy. ?Vagina with normal mucosa and discharge. ?Cervix without visible lesions. ?Uterus firm, mobile, nt, no masses, no CMT, no adnexal tenderness or fullness. pH 4.5. ?Musculoskeletal:  ?   Cervical back: Full passive range of motion without pain, normal range of motion and neck supple.  ?Skin: ?   General: Skin is warm and dry.  ?Neurological:  ?   Mental Status: She is alert and oriented to person, place, and time.  ?Psychiatric:     ?   Attention and Perception: Attention normal.     ?   Mood and Affect: Mood normal.     ?   Speech: Speech normal.     ?   Behavior: Behavior is cooperative.  ? ? ? ?Assessment and Plan:  ?BRONTE SABADO is a  26 y.o. female presenting to the [facility] Department for STI screening ? ?1. Screening examination for venereal disease ?-26 year old female in clinic today for STD screening.  ?-Patient accepted all screenings including oral, vaginal CT/GC and bloodwork for HIV/RPR.  ?Patient meets criteria for HepB screening? No. Ordered? No - low risk  ?Patient meets criteria for HepC screening? No. Ordered? No - low risk  ? ?Treat wet prep per standing order ?Discussed time line for State Lab results and that patient will be called with positive results and encouraged patient to call if she had not heard in 2 weeks.  ?Counseled to return or  seek care for continued or worsening symptoms ?Recommended condom use with all sex ? ?Patient is currently using  Skyla  to prevent pregnancy.   ? ? ? ? ?Return if symptoms worsen or fail to improve. ? ? ? ?Glenna Fellows, FNP ?

## 2021-06-11 NOTE — Progress Notes (Signed)
Patient seen for STD testing. Wet prep reviewed per standing orders. Condoms declined.  ?

## 2021-06-12 ENCOUNTER — Other Ambulatory Visit: Payer: Self-pay

## 2021-06-12 MED ORDER — ELETRIPTAN HYDROBROMIDE 40 MG PO TABS: 40.0000 mg | ORAL_TABLET | ORAL | 0 refills | Status: DC | PRN

## 2021-06-16 LAB — GONOCOCCUS CULTURE

## 2021-07-11 NOTE — Progress Notes (Signed)
? ?NEUROLOGY FOLLOW UP OFFICE NOTE ? ?Virginia Jackson ?[id number] ? ?Assessment/Plan:  ? ?Migraine without aura, without status migrainosus, not intractable  ?  ?Migraine prevention: Aimovig was denied.  Will try Ajovy.  Continue topiramate. ?Migraine rescue:  She will try Nurtec (samples).  Still has Relpax if needed. ?Limit use of pain relievers to no more than 2 days out of week to prevent risk of rebound or medication-overuse headache. ?Keep headache diary ?Follow up 4-5 months. ?  ?  ?Subjective:  ?Virginia Jackson is a 26 year old right-handed female who follows up for migraines. ?  ?UPDATE: ?Due to increased headache frequency, topiramate was increased to 100mg  at bedtime.  Verapamil was discontinued.  Provided schedule to taper off of Fioricet.  Headaches continued and she was started on Aimovig.  Relpax was discontinued and started on Ubrelvy but it was ineffective so she continued Relpax.  Headaches have continued to be daily.   ?Intensity:  moderate to severe ?Duration:  30-60 minutes with Relpax, otherwise all day ?Frequency:  daily  ? ?Only receives 4 Relpax a month. ?Current NSAIDS:  none ?Current analgesics: none ?Current triptans: Relpax 40mg  ?Current ergotamine: None ?Current anti-emetic: None ?Current muscle relaxants: None ?Current anti-anxiolytic: Xanax ?Current sleep aide: None ?Current Antihypertensive medications:  none ?Current Antidepressant medications: none ?Current Anticonvulsant medications: Topiramate 100 mg at bedtime ?Current anti-CGRP: none ?Current Vitamins/Herbal/Supplements: Folic acid ?Current Antihistamines/Decongestants: None ?Other therapy: clonazepam, buspirone ?Hormone/birth control: IUD (no change) ?  ?Caffeine: No ?Diet: Eats healthy.  Drinks plenty water. ?Exercise: Routine ?Depression: No; Anxiety: Yes ?Other pain: No ?Sleep hygiene: Good ?  ?HISTORY: ?Onset:  Childhood ?Location:  Varies (frontal, either side) ?Quality:  pounding ?Initial Intensity:  10/10 ?Aura:   Sometimes sees spots in her vision prior to headache onset ?Prodrome:  on ?Associated symptoms: Nausea, photophobia, phonophobia;  Vomiting when severe. ?Initial Duration:  Until she falls asleep.  Severe episodes last 1 to 3 days ?Initial Frequency:  Daily.  Severe episodes occur once a month to once every other month. ?Triggers/aggravating factors:  movement, stress ?Relieving factors:  Sleep, vomiting, hot shower ?Activity:  Cannot function when severe (once a month to once every other month) ?  ?Past NSAIDS: Advil, Advil migraine, Tylenol Migraine ?Past analgesics: Fioricet ?Past abortive triptans: Maxalt, sumatriptan tablet, Zomig 5 mg NS, Zomig 5 mg tablet, Relpax 40mg  ?Past abortive ergotamine:  none ?Past muscle relaxants:  none ?Past anti-emetic:  none ?Past antihypertensive medications: propranolol ER 60mg , verapamil ?Past antidepressant medications:  paroxetine ?Past anticonvulsant medications:  none ?Past anti-CGRP:  100mg  ?Past vitamins/Herbal/Supplements:  none ?Past antihistamines/decongestants:  none ?Other past therapies:  none ?  ?Family history of headache:  mom ? ?PAST MEDICAL HISTORY: ?Past Medical History:  ?Diagnosis Date  ? Anxiety   ? Degenerative disc disease, lumbar   ? Depression   ? Headache   ? Painful menstrual periods   ? ? ?MEDICATIONS: ?Current Outpatient Medications on File Prior to Visit  ?Medication Sig Dispense Refill  ? ALPRAZolam (XANAX) 0.5 MG tablet Take 1 tablet (0.5 mg total) by mouth 2 (two) times daily as needed for anxiety. Reported on 03/27/2015 (Patient not taking: Reported on 01/10/2021) 60 tablet 2  ? busPIRone (BUSPAR) 10 MG tablet Take 10 mg by mouth 2 (two) times daily.    ? clonazePAM (KLONOPIN) 1 MG tablet Take 1 mg by mouth 2 (two) times daily.    ? cyclobenzaprine (FLEXERIL) 10 MG tablet Take 5-10 mg by mouth at bedtime as needed.    ?  eletriptan (RELPAX) 40 MG tablet Take 1 tablet (40 mg total) by mouth as needed for migraine or headache. May repeat in  2 hours if headache persists or recurs. 10 tablet 0  ? Erenumab-aooe (AIMOVIG) 140 MG/ML SOAJ Inject 140 mg into the skin every 30 (thirty) days. (Patient not taking: Reported on 06/11/2021) 1.12 mL 0  ? fluvoxaMINE (LUVOX) 50 MG tablet Take by mouth.    ? folic acid (FOLVITE) 1 MG tablet Take 1 mg by mouth daily. (Patient not taking: Reported on 06/11/2021)    ? ibuprofen (ADVIL) 800 MG tablet Take 1 tablet (800 mg total) by mouth every 8 (eight) hours as needed for mild pain or moderate pain. (Patient not taking: Reported on 06/11/2021) 30 tablet 0  ? levonorgestrel (KYLEENA) 19.5 MG IUD by Intrauterine route once.    ? LORazepam (ATIVAN) 0.5 MG tablet Take 0.5 mg by mouth 3 (three) times daily as needed.    ? magnesium oxide (MAG-OX) 400 MG tablet Take 400 mg by mouth 2 (two) times daily.    ? magnesium oxide (MAG-OX) 400 MG tablet Take by mouth. (Patient not taking: Reported on 06/11/2021)    ? topiramate (TOPAMAX) 100 MG tablet Take 100 mg by mouth daily.    ? ?Current Facility-Administered Medications on File Prior to Visit  ?Medication Dose Route Frequency Provider Last Rate Last Admin  ? Levonorgestrel IUD 1 Device  1 Device Intrauterine Once [place], Melody N, CNM      ? ? ?ALLERGIES: ?No Known Allergies ? ?FAMILY HISTORY: ?Family History  ?Problem Relation Age of Onset  ? Hypertension Maternal Grandmother   ? Hypertension Maternal Grandfather   ? Cancer Maternal Grandfather   ?     lymphoma   ? Cancer Paternal Grandmother   ?     ovarian   ? Aneurysm Paternal Grandfather   ? Rheum arthritis Mother   ? Hypertension Mother   ? ? ?  ?Objective:  ?Blood pressure 128/84, pulse 100, resp. rate 18, height 5\' 3"  (1.6 m), weight 200 lb (90.7 kg), SpO2 98 %. ?General: No acute distress.  Patient appears well-groomed.   ? ? , DO ? ?CC: [name], MD ? ? ? ? ? ? ?

## 2021-07-13 ENCOUNTER — Ambulatory Visit: Payer: Managed Care, Other (non HMO) | Admitting: Neurology

## 2021-07-13 ENCOUNTER — Encounter: Payer: Self-pay | Admitting: Neurology

## 2021-07-13 VITALS — BP 128/84 | HR 100 | Resp 18 | Ht 63.0 in | Wt 200.0 lb

## 2021-07-13 DIAGNOSIS — G43009 Migraine without aura, not intractable, without status migrainosus: Secondary | ICD-10-CM | POA: Diagnosis not present

## 2021-07-13 NOTE — Patient Instructions (Signed)
Will try to start Ajovy injection every 28 days.  Let me know if it is denied.  Continue topiramate. ?When you get a migraine, try taking Nurtec (one in 24 hours).  If effective, let me know.  Otherwise, continue the Relpax ?Limit use of pain relievers to no more than 2 days out of week to prevent risk of rebound or medication-overuse headache. ?Keep headache diary ?Follow up 4-5 months ?

## 2021-07-24 ENCOUNTER — Encounter: Payer: Self-pay | Admitting: Neurology

## 2021-07-24 ENCOUNTER — Other Ambulatory Visit: Payer: Self-pay | Admitting: Neurology

## 2021-07-24 ENCOUNTER — Telehealth (HOSPITAL_COMMUNITY): Payer: Self-pay | Admitting: Pharmacy Technician

## 2021-07-24 ENCOUNTER — Other Ambulatory Visit (HOSPITAL_COMMUNITY): Payer: Self-pay

## 2021-07-24 MED ORDER — ELETRIPTAN HYDROBROMIDE 40 MG PO TABS: 40.0000 mg | ORAL_TABLET | ORAL | 5 refills | Status: DC | PRN

## 2021-07-24 MED ORDER — AJOVY 225 MG/1.5ML ~~LOC~~ SOAJ: 225.0000 mg | SUBCUTANEOUS | 5 refills | Status: DC

## 2021-07-24 NOTE — Telephone Encounter (Signed)
Patient Advocate Encounter ?  ?Received notification that prior authorization for AJOVY (fremanezumab-vfrm) injection 225MG /1.5ML auto-injectors is required. ?  ?PA submitted on 07/24/2021 ?Key [id number] ?Status is pending ?   ? ? ? ?Lyndel Safe, CPhT ?Pharmacy Patient Advocate Specialist ?[place] Patient Advocate Team ?Direct Number: [phone]  Fax: [phone]  ?

## 2021-07-24 NOTE — Telephone Encounter (Signed)
-----   Message from Leida Lauth, [place] sent at 07/24/2021  9:21 AM EDT ----- ?Regarding: PA Ajovy ?Good morning, ?We just noticed this patient needs a Pa start for Ajovy. If we could see if this could be done expedited.  ? ?

## 2021-07-25 ENCOUNTER — Ambulatory Visit: Payer: Managed Care, Other (non HMO) | Admitting: Neurology

## 2021-07-26 NOTE — Telephone Encounter (Signed)
Patient Advocate Encounter  Prior Authorization for AJOVY (fremanezumab-vfrm) injection 225MG /1.5ML auto-injectors has been approved.    PA# Effective dates: 07/24/2021 through 01/25/2022      01/27/2022, CPhT Pharmacy Patient Advocate Specialist [facility] Patient Advocate Team Direct Number: [phone]  Fax: [phone]

## 2021-07-30 ENCOUNTER — Encounter: Payer: Self-pay | Admitting: Family Medicine

## 2021-07-30 ENCOUNTER — Ambulatory Visit: Payer: Self-pay | Admitting: Family Medicine

## 2021-07-30 DIAGNOSIS — Z30431 Encounter for routine checking of intrauterine contraceptive device: Secondary | ICD-10-CM

## 2021-07-30 DIAGNOSIS — A64 Unspecified sexually transmitted disease: Secondary | ICD-10-CM

## 2021-07-30 LAB — WET PREP FOR TRICH, YEAST, CLUE
Trichomonas Exam: NEGATIVE
Yeast Exam: NEGATIVE

## 2021-07-30 LAB — HM HIV SCREENING LAB: HM HIV Screening: NEGATIVE

## 2021-07-30 NOTE — Progress Notes (Signed)
   [place] problem visit  [place] Department  Subjective:  Virginia Jackson is a 26 y.o. being seen today for   Chief Complaint  Patient presents with   SEXUALLY TRANSMITTED DISEASE    Pt here for STI screening and IUD string check.  Pt denies any s/sx.     Does the patient have a current or past history of drug use? No   No components found for: HCV]   Health Maintenance Due  Topic Date Due   TETANUS/TDAP  08/09/2017   COVID-19 Vaccine (3 - Moderna risk series) 07/26/2019    ROS  The following portions of the patient's history were reviewed and updated as appropriate: allergies, current medications, past family history, past medical history, past social history, past surgical history and problem list. Problem list updated.   See flowsheet for other program required questions.  Objective:  There were no vitals filed for this visit. Last pap smear on 03/2021 was normal.  Gen: NAD Abd: Soft, nontender and nondistended Pelvic: Normal appearing external genitalia; normal appearing vaginal mucosa and cervix.  IUD strings visualized, about 2 cm in length outside cervix.    Assessment and Plan:  Virginia Jackson is a 26 y.o. female presenting to the [facility] Department for a Women's Health problem visit  1. Sexually transmitted disease (STD) Patient accepted all screenings including wet prep, vaginal CT/GC and bloodwork for HIV/RPR.  Patient meets criteria for HepB screening? No. Ordered? No -   Patient meets criteria for HepC screening? No. Ordered? No -    Wet prep results neg    Treatment needed no  Discussed time line for State Lab results and that patient will be called with positive results and encouraged patient to call if she had not heard in 2 weeks.  Counseled to return or seek care for continued or worsening symptoms Recommended condom use with all sex  - WET PREP FOR [place], YEAST, [place] [facility] - Syphilis Serology, [place] [facility]  2. Encounter for routine checking of intrauterine contraceptive device (IUD)  Desires  IUD string check; kylena  was placed  12/2020. No complaints about the  kylena , no concerning side effects.  Normal IUD check. Patient to keep IUD in place for five years; can come in for removal if she desires pregnancy within the next five years. Routine preventative health maintenance measures emphasized   No follow-ups on file.  Future Appointments  Date Time Provider [place]  12/31/2021  3:30 PM Pieter Partridge, DO [facility] None  04/02/2022  2:30 PM Philip Aspen, CNM [facility] None    Junious Dresser, FNP

## 2021-07-30 NOTE — Progress Notes (Signed)
Patient here for STD testing. Wet prep reviewed, no tx per standing orders. 

## 2021-08-15 ENCOUNTER — Other Ambulatory Visit (HOSPITAL_COMMUNITY): Payer: Self-pay

## 2021-08-29 ENCOUNTER — Other Ambulatory Visit: Payer: Self-pay | Admitting: Neurology

## 2021-12-10 ENCOUNTER — Encounter: Payer: Self-pay | Admitting: Certified Nurse Midwife

## 2021-12-25 NOTE — Progress Notes (Signed)
Virtual Visit via Video Note  Consent was obtained for video visit:  Yes.   Answered questions that patient had about telehealth interaction:  Yes.   I discussed the limitations, risks, security and privacy concerns of performing an evaluation and management service by telemedicine. I also discussed with the patient that there may be a patient responsible charge related to this service. The patient expressed understanding and agreed to proceed.  Pt location: Home Physician Location: office Name of referring provider:  Idelle Crouch, MD I connected with Virginia Jackson at patients initiation/request on 12/31/2021 at  8:30 AM EDT by video enabled telemedicine application and verified that I am speaking with the correct person using two identifiers. Pt MRN:  [id number] Pt DOB:  [birth year] Video Participants:  Virginia Jackson  Assessment and Plan:   Migraine without aura, without status migrainosus, not intractable    Migraine prevention: She will start [place]; continue topiramate 100mg  QHS, propranolol ER 80mg  Migraine rescue:  Relpax.  Effective but only receives 4 tablets a month.  Will have her try samples of Zavzpret NS (she will come by office on Wednesday to pick up.   Limit use of pain relievers to no more than 2 days out of week to prevent risk of rebound or medication-overuse headache. Keep headache diary Follow up 4-5 months.  History of Present Illness:  Virginia Jackson is a 26 year old right-handed female who follows up for migraines.   UPDATE: Never started Ajovy because  she believed that insurance wouldn't cover it.  However it was approved.  Nurtec was not effective.   Intensity:  moderate to severe Duration:  30-60 minutes with Relpax, otherwise all day Frequency:  2 days a week   Only receives 4 Relpax a month. Current NSAIDS:  none Current analgesics: none Current triptans: Relpax 40mg  Current ergotamine: None Current anti-emetic: None Current muscle  relaxants: None Current anti-anxiolytic: Xanax Current sleep aide: None Current Antihypertensive medications:  propranolol ER 80mg  daily Current Antidepressant medications: none Current Anticonvulsant medications: Topiramate 100 mg at bedtime Current anti-CGRP: none Current Vitamins/Herbal/Supplements: Folic acid Current Antihistamines/Decongestants: None Other therapy: clonazepam, buspirone Hormone/birth control: IUD (no change)   Caffeine: Mt Dew once in awhile to treat headache.  Diet: Eats healthy.  Drinks plenty water. Exercise: Routine Depression: No; Anxiety: Yes Other pain: No Sleep hygiene: Good   HISTORY: Onset:  Childhood Location:  Varies (frontal, either side) Quality:  pounding Initial Intensity:  10/10 Aura:  Sometimes sees spots in her vision prior to headache onset Prodrome:  on Associated symptoms: Nausea, photophobia, phonophobia;  Vomiting when severe. Initial Duration:  Until she falls asleep.  Severe episodes last 1 to 3 days Initial Frequency:  Daily.  Severe episodes occur once a month to once every other month. Triggers/aggravating factors:  movement, stress Relieving factors:  Sleep, vomiting, hot shower Activity:  Cannot function when severe (once a month to once every other month)   Past NSAIDS: Advil, Advil migraine, Tylenol Migraine Past analgesics: Fioricet Past abortive triptans: Maxalt, sumatriptan tablet, Zomig 5 mg NS, Zomig 5 mg tablet, Relpax 40mg  Past abortive ergotamine:  none Past muscle relaxants:  none Past anti-emetic:  none Past antihypertensive medications: propranolol ER 60mg , verapamil Past antidepressant medications:  paroxetine Past anticonvulsant medications:  none Past anti-CGRP:  Ubrelvy 100mg , Nurtec (rescue) Past vitamins/Herbal/Supplements:  none Past antihistamines/decongestants:  none Other past therapies:  none   Family history of headache:  mom  Past Medical History: Past Medical History:  Diagnosis Date  Anxiety    Degenerative disc disease, lumbar    Depression    Headache    Painful menstrual periods     Medications: Outpatient Encounter Medications as of 12/31/2021  Medication Sig Note   ALPRAZolam (XANAX) 0.5 MG tablet Take 1 tablet (0.5 mg total) by mouth 2 (two) times daily as needed for anxiety. Reported on 03/27/2015    cyclobenzaprine (FLEXERIL) 10 MG tablet Take 5-10 mg by mouth at bedtime as needed. (Patient not taking: Reported on 07/13/2021)    eletriptan (RELPAX) 40 MG tablet Take 1 tablet (40 mg total) by mouth as needed for migraine or headache. May repeat in 2 hours if headache persists or recurs.    folic acid (FOLVITE) 1 MG tablet Take 1 mg by mouth daily. (Patient not taking: Reported on 06/11/2021)    Fremanezumab-vfrm (AJOVY) 225 MG/1.5ML SOAJ Inject 225 mg into the skin every 28 (twenty-eight) days.    ibuprofen (ADVIL) 800 MG tablet Take 1 tablet (800 mg total) by mouth every 8 (eight) hours as needed for mild pain or moderate pain. (Patient not taking: Reported on 06/11/2021)    levonorgestrel (KYLEENA) 19.5 MG IUD by Intrauterine route once. 08/16/2020: Pt had this IUD for about a year per her mother   LORazepam (ATIVAN) 0.5 MG tablet Take 0.5 mg by mouth 3 (three) times daily as needed. (Patient not taking: Reported on 07/13/2021)    propranolol ER (INDERAL LA) 80 MG 24 hr capsule Take 80 mg by mouth daily.    topiramate (TOPAMAX) 100 MG tablet TAKE 1 TABLET(100 MG) BY MOUTH AT BEDTIME    Facility-Administered Encounter Medications as of 12/31/2021  Medication   Levonorgestrel IUD 1 Device    Allergies: No Known Allergies  Family History: Family History  Problem Relation Age of Onset   Hypertension Maternal Grandmother    Hypertension Maternal Grandfather    Cancer Maternal Grandfather        lymphoma    Cancer Paternal Grandmother        ovarian    Aneurysm Paternal Grandfather    Rheum arthritis Mother    Hypertension Mother     Observations/Objective:    No acute distress.  Alert and oriented.  Speech fluent and not dysarthric.  Language intact.     Follow Up Instructions:    -I discussed the assessment and treatment plan with the patient. The patient was provided an opportunity to ask questions and all were answered. The patient agreed with the plan and demonstrated an understanding of the instructions.   The patient was advised to call back or seek an in-person evaluation if the symptoms worsen or if the condition fails to improve as anticipated.   Dudley Major, DO

## 2021-12-26 ENCOUNTER — Ambulatory Visit (LOCAL_COMMUNITY_HEALTH_CENTER): Payer: 59 | Admitting: Nurse Practitioner

## 2021-12-26 VITALS — BP 116/73 | Ht 63.0 in | Wt 202.0 lb

## 2021-12-26 DIAGNOSIS — Z3009 Encounter for other general counseling and advice on contraception: Secondary | ICD-10-CM | POA: Diagnosis not present

## 2021-12-26 DIAGNOSIS — Z113 Encounter for screening for infections with a predominantly sexual mode of transmission: Secondary | ICD-10-CM

## 2021-12-26 DIAGNOSIS — B379 Candidiasis, unspecified: Secondary | ICD-10-CM

## 2021-12-26 LAB — WET PREP FOR TRICH, YEAST, CLUE: Trichomonas Exam: NEGATIVE

## 2021-12-26 MED ORDER — LEVONORGESTREL 1.5 MG PO TABS: 1.5000 mg | ORAL_TABLET | Freq: Once | ORAL | 0 refills | Status: AC

## 2021-12-26 MED ORDER — CLOTRIMAZOLE 3 2 % VA CREA: 1.0000 | TOPICAL_CREAM | Freq: Every day | VAGINAL | 0 refills | Status: DC

## 2021-12-26 NOTE — Progress Notes (Signed)
Pt appointment for IUD strings chek and STD screening. Seen by FNP White, and results and treatment dispensed by FNP White.

## 2021-12-26 NOTE — Progress Notes (Unsigned)
[place] problem visit  [place] Department  Subjective:  Virginia Jackson is a 26 y.o. being seen today for an IUD check and STD screening.   Chief Complaint  Patient presents with   Contraception    IUD string check and std screening     HPI   Does the patient have a current or past history of drug use? No   No components found for: "HCV"]   Health Maintenance Due  Topic Date Due   TETANUS/TDAP  08/09/2017   COVID-19 Vaccine (3 - Moderna risk series) 07/26/2019   INFLUENZA VACCINE  10/09/2021    Review of Systems  Constitutional:  Negative for chills, fever, malaise/fatigue and weight loss.  HENT:  Negative for congestion, hearing loss and sore throat.   Eyes:  Negative for blurred vision, double vision and photophobia.  Respiratory:  Negative for shortness of breath.   Cardiovascular:  Negative for chest pain.  Gastrointestinal:  Negative for abdominal pain, blood in stool, constipation, diarrhea, heartburn, nausea and vomiting.  Genitourinary:  Negative for dysuria and frequency.  Musculoskeletal:  Negative for back pain, joint pain and neck pain.  Skin:  Negative for itching and rash.  Neurological:  Negative for dizziness, weakness and headaches.  Endo/Heme/Allergies:  Does not bruise/bleed easily.  Psychiatric/Behavioral:  Negative for depression, substance abuse and suicidal ideas.     The following portions of the patient's history were reviewed and updated as appropriate: allergies, current medications, past family history, past medical history, past social history, past surgical history and problem list. Problem list updated.   See flowsheet for other program required questions.  Objective:   Vitals:   12/26/21 0926  BP: 116/73  Weight: 202 lb (91.6 kg)  Height: 5\' 3"  (1.6 m)    Physical Exam Constitutional:      Appearance: Normal appearance.  HENT:     Head: Normocephalic. No abrasion, masses or laceration. Hair is  normal.     Jaw: No tenderness or swelling.     Right Ear: External ear normal.     Left Ear: External ear normal.     Nose: Nose normal.     Mouth/Throat:     Lips: Pink. No lesions.     Mouth: Mucous membranes are moist. No lacerations or oral lesions.     Dentition: No dental caries.     Tongue: No lesions.     Palate: No mass and lesions.     Pharynx: No pharyngeal swelling, oropharyngeal exudate, posterior oropharyngeal erythema or uvula swelling.     Tonsils: No tonsillar exudate or tonsillar abscesses.  Eyes:     Pupils: Pupils are equal, round, and reactive to light.  Neck:     Thyroid: No thyroid mass, thyromegaly or thyroid tenderness.  Cardiovascular:     Rate and Rhythm: Normal rate and regular rhythm.  Pulmonary:     Effort: Pulmonary effort is normal.     Breath sounds: Normal breath sounds.  Abdominal:     General: Abdomen is flat. Bowel sounds are normal.     Palpations: Abdomen is soft.     Tenderness: There is no abdominal tenderness. There is no rebound.  Genitourinary:    Pubic Area: No rash or pubic lice.      Labia:        Right: No rash, tenderness or lesion.        Left: No rash, tenderness or lesion.      Vagina: Normal. No  vaginal discharge, erythema, tenderness or lesions.     Cervix: No cervical motion tenderness, discharge, lesion or erythema.     Uterus: Normal.      Adnexa:        Right: No tenderness.         Left: No tenderness.       Rectum: Normal.     Comments: Amount Discharge:moderate Odor: No pH: greater than 4.5 Adheres to vaginal wall: No Color: bright red, color of discharge matches the Saphia Vanderford swab, yeast present   Musculoskeletal:     Cervical back: Full passive range of motion without pain and normal range of motion.  Lymphadenopathy:     Cervical: No cervical adenopathy.     Right cervical: No superficial, deep or posterior cervical adenopathy.    Left cervical: No superficial, deep or posterior cervical adenopathy.      Upper Body:     Right upper body: No epitrochlear adenopathy.     Left upper body: No epitrochlear adenopathy.     Lower Body: No right inguinal adenopathy. No left inguinal adenopathy.  Skin:    General: Skin is warm and dry.     Findings: No erythema, laceration, lesion or rash.  Neurological:     Mental Status: She is alert and oriented to person, place, and time.  Psychiatric:        Attention and Perception: Attention normal.        Mood and Affect: Mood normal.        Speech: Speech normal.        Behavior: Behavior normal. Behavior is cooperative.      Assessment and Plan:  Virginia Jackson is a 26 y.o. female presenting to the [facility] for a Women's Health problem visit  1. Family planning counseling -IUD string check. IUD strings not visualized.  Cytobrush used and unsuccessful.  Patient desires to contact her OBGYN to see if they can provide  a guided U/S.  Patient reported was able to schedule an appointment with OBGYN office.   Patient may have Plan B to cover unprotected sex that occurred 3 days ago, due to unknown location of IUD.    - levonorgestrel (PLAN B ONE-STEP) 1.5 MG tablet; Take 1 tablet (1.5 mg total) by mouth once for 1 dose.  Dispense: 1 tablet; Refill: 0  2. Screening examination for venereal disease -STD screening today.  -Patient accepted all screenings including oral GC, vaginal CT/GC and declines bloodwork for HIV/RPR.  Patient meets criteria for HepB screening? No. Ordered? No - low risk  Patient meets criteria for HepC screening? No. Ordered? No - low risk   Treat wet prep per standing order Discussed time line for [facility] results and that patient will be called with positive results and encouraged patient to call if she had not heard in 2 weeks.  Counseled to return or seek care for continued or worsening symptoms Recommended condom use with all sex  Patient is currently using  IUD   to prevent pregnancy.    - WET PREP  FOR TRICH, YEAST, CLUE - Gonococcus culture - Chlamydia/Gonorrhea [facility]   Total time spent: 30 minutes   No follow-ups on file.  Future Appointments  Date Time Provider Department Center  12/31/2021  8:30 AM Drema Dallas, DO [facility] None  01/02/2022  8:30 AM Glenetta Borg, CNM [facility] None  04/02/2022  2:30 PM Doreene Burke, CNM [facility] None    Glenna Fellows, FNP

## 2021-12-31 ENCOUNTER — Telehealth (INDEPENDENT_AMBULATORY_CARE_PROVIDER_SITE_OTHER): Payer: 59 | Admitting: Neurology

## 2021-12-31 ENCOUNTER — Encounter: Payer: Self-pay | Admitting: Neurology

## 2021-12-31 VITALS — Ht 63.0 in | Wt 203.0 lb

## 2021-12-31 DIAGNOSIS — G43009 Migraine without aura, not intractable, without status migrainosus: Secondary | ICD-10-CM | POA: Diagnosis not present

## 2021-12-31 LAB — GONOCOCCUS CULTURE

## 2021-12-31 MED ORDER — AJOVY 225 MG/1.5ML ~~LOC~~ SOAJ: 225.0000 mg | SUBCUTANEOUS | 5 refills | Status: DC

## 2021-12-31 MED ORDER — PROPRANOLOL HCL ER 80 MG PO CP24: 80.0000 mg | ORAL_CAPSULE | Freq: Every day | ORAL | 5 refills | Status: DC

## 2021-12-31 MED ORDER — TOPIRAMATE 100 MG PO TABS: ORAL_TABLET | ORAL | 5 refills | Status: DC

## 2021-12-31 NOTE — Patient Instructions (Signed)
Start Ajovy every 28 days Continue topiramate and propranolol Continue Relpax as needed for now.  Come by office on Wednesday to pick up sample of nasal spray Zavzpret - one spray in one nostril in 24 hours. Follow up 4-5 months.

## 2022-01-02 ENCOUNTER — Ambulatory Visit
Admission: RE | Admit: 2022-01-02 | Discharge: 2022-01-02 | Disposition: A | Discharge status: Home / Self Care | Payer: 59 | Source: Ambulatory Visit | Attending: Obstetrics | Admitting: Obstetrics

## 2022-01-02 ENCOUNTER — Encounter: Payer: Self-pay | Admitting: Obstetrics

## 2022-01-02 ENCOUNTER — Ambulatory Visit: Payer: 59 | Admitting: Obstetrics

## 2022-01-02 ENCOUNTER — Telehealth: Payer: Self-pay | Admitting: Neurology

## 2022-01-02 ENCOUNTER — Other Ambulatory Visit (INDEPENDENT_AMBULATORY_CARE_PROVIDER_SITE_OTHER): Payer: 59

## 2022-01-02 ENCOUNTER — Ambulatory Visit
Admission: RE | Admit: 2022-01-02 | Discharge: 2022-01-02 | Disposition: A | Discharge status: Home / Self Care | Payer: 59 | Attending: Obstetrics | Admitting: Obstetrics

## 2022-01-02 VITALS — BP 126/85 | HR 101 | Wt 203.6 lb

## 2022-01-02 DIAGNOSIS — Z3202 Encounter for pregnancy test, result negative: Secondary | ICD-10-CM

## 2022-01-02 DIAGNOSIS — T8332XA Displacement of intrauterine contraceptive device, initial encounter: Secondary | ICD-10-CM | POA: Insufficient documentation

## 2022-01-02 DIAGNOSIS — Z7251 High risk heterosexual behavior: Secondary | ICD-10-CM

## 2022-01-02 LAB — POCT URINE PREGNANCY: Preg Test, Ur: NEGATIVE

## 2022-01-02 NOTE — Telephone Encounter (Signed)
Patient had already left to go back to school. Her mom is going to pick up there samples on Monday

## 2022-01-02 NOTE — Addendum Note (Signed)
Addended by: Lloyd Huger on: 01/02/2022 01:50 PM   Modules accepted: Orders

## 2022-01-02 NOTE — Progress Notes (Addendum)
GYN ENCOUNTER  Subjective  HPI: Virginia Jackson is a 26 y.o. G0P0000 who presents today for evaluation of IUD placement. She had a Kyleena IUD placed in 2021. She has irregular periods since placement. She reports that she recently had intercourse and the condom broke. She took Plan B after this. [facility] and [facility] provider was unable to located her IUD strings. She was referred to our office for follow up. She denies abdominal and pelvic pain, cramping, abnormal bleeding or discharge.  STI testing was done at the [facility] and was negative.  Past Medical History:  Diagnosis Date   Anxiety    Degenerative disc disease, lumbar    Depression    Headache    Painful menstrual periods    Past Surgical History:  Procedure Laterality Date   INCISION AND DRAINAGE ABSCESS N/A 08/18/2020   Procedure: INCISION AND DRAINAGE ABSCESS;  Surgeon: Benjamine Sprague, DO;  Location: [facility] ORS;  Service: General;  Laterality: N/A;   OB History     Gravida  0   Para  0   Term  0   Preterm  0   AB  0   Living  0      SAB  0   IAB  0   Ectopic  0   Multiple  0   Live Births             No Known Allergies   Negative except as noted in HPI History obtained from the patient  Objective  BP 126/85   Pulse (!) 101   Wt 203 lb 9.6 oz (92.4 kg)   LMP 12/09/2021 (Approximate)   BMI 36.07 kg/m   General appearance: alert, cooperative Pelvic: External genitalia normal, Vagina normal without discharge, cervix normal in appearance, IUD strings not visualized Attempted to locate strings using brush and IUD hook, but was not able to.  Virginia Jackson was sent to [place], and the IUD was not seen in the uterus on US exam.  Assessment 1) Expelled or displaced IUD 2) Unprotected intercourse  Plan 1) bhCG drawn today. If negative and HPT negative in 2 weeks, return for IUD placement or alternative contraceptive method. 2) Reviewed plan with Dr. Ellard Artis. Recommend abdominal X  ray in case of uterine perforation/IUD displacement. [facility] is in agreement with plan.  F/u based on results.   Virginia Jackson, CNM

## 2022-01-02 NOTE — Telephone Encounter (Signed)
Medication at front for pick up

## 2022-01-02 NOTE — Telephone Encounter (Signed)
Samples were to be left at the front desk for patient for the nasal spray for her migraines. The sample name is Zavzpert. We did not have any in  the office to give her.   Please call her when we get some in the office and her Mother Brayton Layman will come and pick it up since patient leaves out of [facility]

## 2022-01-02 NOTE — Telephone Encounter (Signed)
The rep will bring samples today

## 2022-01-03 ENCOUNTER — Other Ambulatory Visit: Payer: Self-pay | Admitting: Advanced Practice Midwife

## 2022-01-03 ENCOUNTER — Encounter: Payer: Self-pay | Admitting: Advanced Practice Midwife

## 2022-01-03 ENCOUNTER — Telehealth: Payer: Self-pay

## 2022-01-03 ENCOUNTER — Encounter: Payer: Self-pay | Admitting: Neurology

## 2022-01-03 DIAGNOSIS — Z30011 Encounter for initial prescription of contraceptive pills: Secondary | ICD-10-CM

## 2022-01-03 LAB — HUMAN CHORIONIC GONADOTROPIN(HCG),B-SUBUNIT,QUANTITATIVE): HCG, Beta Chain, Quant, S: <1 m[IU]/mL

## 2022-01-03 MED ORDER — NORGESTIMATE-ETH ESTRADIOL 0.25-35 MG-MCG PO TABS: 1.0000 | ORAL_TABLET | Freq: Every day | ORAL | 11 refills | Status: DC

## 2022-01-03 NOTE — Progress Notes (Signed)
Rx sprintec sent. Message sent to patient.

## 2022-01-03 NOTE — Telephone Encounter (Signed)
Patient called back crying stating that she would like a call back ASAP from provider n regards to her ultrasound and xray results. Patient states that results have now come through my chart and she is still in [place] and does not want to leave until she speaks with a doctor in regards to these results. Patient is requesting a call back at [phone]. KW

## 2022-01-03 NOTE — Telephone Encounter (Signed)
Patient contacted office requesting for results for her Ultrasound and x-ray. Patient states that she lives out of town and will be leaving [place] today and would like to have a call back right away once results are reviewed. KW

## 2022-01-03 NOTE — Telephone Encounter (Signed)
[id number] 

## 2022-01-03 NOTE — Telephone Encounter (Signed)
Patient was advised of your message below, she states that she would like to start OCP and does not want to be without birth control. Patient states that she saw Mrs. Swanson yesterday but plans on having IUD placed again 04/2022, during the meantime she is requesting OCP be sent to [facility] located at [place], [place] [address]. KW

## 2022-01-03 NOTE — Telephone Encounter (Signed)
Please let patient know that official reads are still not available at this time, sometimes can take up to 48 hours. However I did look at the images and don't see the IUD anywhere in her uterus or her abdomen. She may have possibly expelled it during an episode of bleeding.

## 2022-01-04 ENCOUNTER — Telehealth: Payer: Self-pay | Admitting: Neurology

## 2022-01-04 ENCOUNTER — Encounter: Payer: Self-pay | Admitting: Obstetrics

## 2022-01-04 DIAGNOSIS — G43009 Migraine without aura, not intractable, without status migrainosus: Secondary | ICD-10-CM

## 2022-01-04 NOTE — Telephone Encounter (Signed)
Patient has been advised. KW 

## 2022-01-04 NOTE — Telephone Encounter (Signed)
Sanda from [employer] stated patient has applied for financial assistance for Ajovy, they need a new Rx that is hand signed w/ ICD-10 code listed.  Fax: [phone]

## 2022-01-07 MED ORDER — AJOVY 225 MG/1.5ML ~~LOC~~ SOAJ: 225.0000 mg | SUBCUTANEOUS | 5 refills | Status: DC

## 2022-01-07 NOTE — Telephone Encounter (Signed)
Sent!

## 2022-04-02 ENCOUNTER — Encounter: Payer: Self-pay | Admitting: Certified Nurse Midwife

## 2022-04-15 ENCOUNTER — Ambulatory Visit: Payer: 59 | Admitting: Certified Nurse Midwife

## 2022-04-18 ENCOUNTER — Ambulatory Visit: Payer: Self-pay | Admitting: Certified Nurse Midwife

## 2022-05-06 ENCOUNTER — Ambulatory Visit: Payer: 59 | Admitting: Neurology

## 2022-05-09 ENCOUNTER — Ambulatory Visit: Payer: Self-pay | Admitting: Certified Nurse Midwife

## 2022-05-29 ENCOUNTER — Encounter: Payer: Self-pay | Admitting: Certified Nurse Midwife

## 2022-05-29 ENCOUNTER — Ambulatory Visit (INDEPENDENT_AMBULATORY_CARE_PROVIDER_SITE_OTHER): Payer: BC Managed Care – PPO | Admitting: Certified Nurse Midwife

## 2022-05-29 ENCOUNTER — Other Ambulatory Visit (HOSPITAL_COMMUNITY)
Admission: RE | Admit: 2022-05-29 | Discharge: 2022-05-29 | Disposition: A | Discharge status: Home / Self Care | Payer: BC Managed Care – PPO | Source: Ambulatory Visit | Attending: Certified Nurse Midwife | Admitting: Certified Nurse Midwife

## 2022-05-29 VITALS — BP 125/83 | HR 90 | Ht 63.0 in | Wt 189.5 lb

## 2022-05-29 DIAGNOSIS — Z01419 Encounter for gynecological examination (general) (routine) without abnormal findings: Secondary | ICD-10-CM | POA: Insufficient documentation

## 2022-05-29 DIAGNOSIS — Z113 Encounter for screening for infections with a predominantly sexual mode of transmission: Secondary | ICD-10-CM | POA: Diagnosis present

## 2022-05-29 MED ORDER — OXYCODONE-ACETAMINOPHEN 5-325 MG PO TABS: 1.0000 | ORAL_TABLET | ORAL | 0 refills | Status: DC | PRN

## 2022-05-29 NOTE — Addendum Note (Signed)
Addended by: Hildred Priest on: 05/29/2022 12:03 PM   Modules accepted: Orders

## 2022-05-29 NOTE — Patient Instructions (Signed)

## 2022-05-29 NOTE — Progress Notes (Addendum)
GYNECOLOGY ANNUAL PREVENTATIVE CARE ENCOUNTER NOTE  History:     Virginia Jackson is a 27 y.o. G0P0000 female here for a routine annual gynecologic exam.  Current complaints: none.  Request swab for STD screening.  Denies abnormal vaginal bleeding, discharge, pelvic pain, problems with intercourse or other gynecologic concerns.     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 Contraception: OCP (estrogen/progesterone) Last Pap: 03/26/2021. Results were: normal  Last mammogram: n.a .   Upstream - 05/29/22 [id number]       Pregnancy Intention Screening   Does the patient want to become pregnant in the next year? No    Does the patient's partner want to become pregnant in the next year? No    Would the patient like to discuss contraceptive options today? No      Contraception Wrap Up   Current Method Oral Contraceptive    End Method Oral Contraceptive    Contraception Counseling Provided Yes            The pregnancy intention screening data noted above was reviewed. Potential methods of contraception were discussed. The patient elected to proceed with Oral Contraceptive. Considering IUD again.    Obstetric History OB History  Gravida Para Term Preterm AB Living  0 0 0 0 0 0  SAB IAB Ectopic Multiple Live Births  0 0 0 0      Past Medical History:  Diagnosis Date   Anxiety    Degenerative disc disease, lumbar    Depression    Headache    Painful menstrual periods     Past Surgical History:  Procedure Laterality Date   INCISION AND DRAINAGE ABSCESS N/A 08/18/2020   Procedure: INCISION AND DRAINAGE ABSCESS;  Surgeon: Benjamine Sprague, DO;  Location: [facility] ORS;  Service: General;  Laterality: N/A;    Current Outpatient Medications on  File Prior to Visit  Medication Sig Dispense Refill   ALPRAZolam (XANAX) 1 MG tablet Take 1 mg by mouth 3 (three) times daily as needed.     cyclobenzaprine (FLEXERIL) 10 MG tablet Take 5-10 mg by mouth at bedtime as needed.     eletriptan (RELPAX) 40 MG tablet Take 1 tablet (40 mg total) by mouth as needed for migraine or headache. May repeat in 2 hours if headache persists or recurs. 10 tablet 5   Fremanezumab-vfrm (AJOVY) 225 MG/1.5ML SOAJ Inject 225 mg into the skin every 28 (twenty-eight) days. 1.68 mL 5   ibuprofen (ADVIL) 800 MG tablet Take 1 tablet (800 mg total) by mouth every 8 (eight) hours as needed for mild pain or moderate pain. 30 tablet 0   lisdexamfetamine (VYVANSE) 50 MG capsule Take by mouth.     magnesium oxide (MAG-OX) 400 (240 Mg) MG tablet Take 400 mg by mouth daily.     norgestimate-ethinyl estradiol (ORTHO-CYCLEN) 0.25-35 MG-MCG tablet Take 1 tablet by mouth daily. 30 tablet 11   propranolol ER (INDERAL LA) 80 MG 24 hr capsule Take 1 capsule (80 mg total) by mouth daily. 30 capsule 5   topiramate (TOPAMAX) 100 MG tablet TAKE 1 TABLET(100 MG) BY MOUTH AT BEDTIME 30 tablet 5   No current facility-administered medications on file prior to visit.    No Known Allergies  Social History:  reports that she has never smoked. She has never used smokeless tobacco. She reports that she does not currently use alcohol. She reports that she does not use drugs.  Family History  Problem Relation Age of Onset   Hypertension Maternal Grandmother    Hypertension Maternal Grandfather    Cancer Maternal Grandfather        lymphoma    Cancer Paternal Grandmother        ovarian    Aneurysm Paternal Grandfather    Rheum arthritis Mother    Hypertension Mother     The following portions of the patient's history were reviewed and updated as appropriate: allergies, current medications, past family history, past medical history, past social history, past surgical history and problem  list.  Review of Systems Pertinent items noted in HPI and remainder of comprehensive ROS otherwise negative.  Physical Exam:  BP 125/83   Pulse 90   Ht 5\' 3"  (1.6 m)   Wt 189 lb 8 oz (86 kg)   LMP 05/19/2022 (Exact Date)   BMI 33.57 kg/m  CONSTITUTIONAL: Well-developed, well-nourished female in no acute distress.  HENT:  Normocephalic, atraumatic, External right and left ear normal. Oropharynx is clear and moist EYES: Conjunctivae and EOM are normal. Pupils are equal, round, and reactive to light. No scleral icterus.  NECK: Normal range of motion, supple, no masses.  Normal thyroid.  SKIN: Skin is warm and dry. No rash noted. Not diaphoretic. No erythema. No pallor. MUSCULOSKELETAL: Normal range of motion. No tenderness.  No cyanosis, clubbing, or edema.  2+ distal pulses. NEUROLOGIC: Alert and oriented to person, place, and time. Normal reflexes, muscle tone coordination.  PSYCHIATRIC: Normal mood and affect. Normal behavior. Normal judgment and thought content. CARDIOVASCULAR: Normal heart rate noted, regular rhythm RESPIRATORY: Clear to auscultation bilaterally. Effort and breath sounds normal, no problems with respiration noted. BREASTS: Symmetric in size. No masses, tenderness, skin changes, nipple drainage, or lymphadenopathy bilaterally.  ABDOMEN: Soft, no distention noted.  No tenderness, rebound or guarding.  PELVIC: Normal appearing external genitalia and urethral meatus; normal appearing vaginal mucosa and cervix.  No abnormal discharge noted.  Pap smear obtained.  Normal uterine size, no other palpable masses, no uterine or adnexal tenderness.  .   Assessment and Plan:    1. Women's annual routine gynecological examination  Pap: not due  Mammogram : n/a  Labs:  vaginal swab Refills: none Referral: none Discussed all birth control options. Pt is considering using IUD again. States she was traumatized that the last one came out and she did not know that it had come out.  Discussed importance of checking the strings.  Routine preventative health maintenance measures emphasized. Please refer to After Visit Summary for other counseling recommendations.    Discussed medication pre procedure for IUD placement . Orders placed for percocet   Philip Aspen, Gross

## 2022-05-30 LAB — CERVICOVAGINAL ANCILLARY ONLY
Bacterial Vaginitis (gardnerella): POSITIVE — AB
Candida Glabrata: NEGATIVE
Candida Vaginitis: NEGATIVE
Chlamydia: NEGATIVE
Comment: NEGATIVE
Comment: NEGATIVE
Comment: NEGATIVE
Comment: NEGATIVE
Comment: NORMAL
Neisseria Gonorrhea: NEGATIVE

## 2022-05-31 ENCOUNTER — Encounter: Payer: Self-pay | Admitting: Certified Nurse Midwife

## 2022-05-31 ENCOUNTER — Other Ambulatory Visit: Payer: Self-pay

## 2022-05-31 DIAGNOSIS — B9689 Other specified bacterial agents as the cause of diseases classified elsewhere: Secondary | ICD-10-CM

## 2022-05-31 MED ORDER — METRONIDAZOLE 500 MG PO TABS: 500.0000 mg | ORAL_TABLET | Freq: Two times a day (BID) | ORAL | 0 refills | Status: DC

## 2022-06-01 ENCOUNTER — Encounter: Payer: Self-pay | Admitting: Certified Nurse Midwife

## 2022-06-01 ENCOUNTER — Other Ambulatory Visit: Payer: Self-pay | Admitting: Certified Nurse Midwife

## 2022-07-03 ENCOUNTER — Ambulatory Visit: Payer: BC Managed Care – PPO | Admitting: Certified Nurse Midwife

## 2022-07-03 ENCOUNTER — Encounter: Payer: Self-pay | Admitting: Certified Nurse Midwife

## 2022-07-03 VITALS — BP 121/80 | HR 87 | Resp 16 | Wt 189.3 lb

## 2022-07-03 DIAGNOSIS — Z3202 Encounter for pregnancy test, result negative: Secondary | ICD-10-CM | POA: Diagnosis not present

## 2022-07-03 DIAGNOSIS — Z30014 Encounter for initial prescription of intrauterine contraceptive device: Secondary | ICD-10-CM | POA: Diagnosis not present

## 2022-07-03 LAB — POCT URINE PREGNANCY: Preg Test, Ur: NEGATIVE

## 2022-07-03 MED ORDER — LEVONORGESTREL 20 MCG/DAY IU IUD
1.0000 | INTRAUTERINE_SYSTEM | Freq: Once | INTRAUTERINE | Status: AC
  Administered 2022-07-03: 1 via INTRAUTERINE

## 2022-07-03 NOTE — Progress Notes (Signed)
    GYNECOLOGY OFFICE PROCEDURE NOTE  Virginia Jackson is a 27 y.o. G0P0000 here for mirena IUD insertion. No GYN concerns.  Last pap smear was on 03/26/2021 and was normal.  IUD Insertion Procedure Note Patient identified, informed consent performed, consent signed.   Discussed risks of irregular bleeding, cramping, infection, malpositioning or misplacement of the IUD outside the uterus which may require further procedure such as laparoscopy. Also discussed >99% contraception efficacy, increased risk of ectopic pregnancy with failure of method.   Emphasized that this did not protect against STIs, condoms recommended during all sexual encounters. Time out was performed.  Urine pregnancy test negative.  Speculum placed in the vagina.  Cervix visualized.  Cleaned with Betadine x 2.  Grasped anteriorly with a single tooth tenaculum.  Uterus sounded to 7 cm.  Mirena IUD placed per manufacturer's recommendations.  Strings trimmed to 3 cm. Tenaculum was removed, good hemostasis noted.  Patient tolerated procedure well.   Patient was given post-procedure instructions.  She was advised to have backup contraception for one week.  Patient was also asked to check IUD strings periodically and follow up in 4 weeks for IUD check.   Doreene Burke, CNM

## 2022-07-03 NOTE — Progress Notes (Signed)
NEUROLOGY FOLLOW UP OFFICE NOTE  Virginia Jackson [id number]  Assessment/Plan:   Migraine without aura, without status migrainosus, not intractable - currently doing well, so I wouldn't add another medication at this time   Migraine prevention: Topiramate 100mg  QHS, propranolol ER 80mg   Migraine rescue:  Relpax.  May use Tylenol Migraine.   Limit use of pain relievers to no more than 2 days out of week to prevent risk of rebound or medication-overuse headache. Keep headache diary Follow up 6 months.  Subjective:  Virginia Jackson is a 27 year old right-handed female who follows up for migraines.   UPDATE: Plan was to start Ajovy but she lost her insurance and didn't qualify for the financial assistance program. Zavzpret not effective.     Intensity:  moderate to severe Duration:  maximum 2 hours with Relpax Frequency:  once every other week With her migraines, she notes more lightheadedness and sees "black spots" in her vision.      Current NSAIDS:  Tylenol Migraine Current analgesics: none Current triptans: Relpax 40mg  Current ergotamine: None Current anti-emetic: None Current muscle relaxants: None Current anti-anxiolytic: Xanax Current sleep aide: None Current Antihypertensive medications:  propranolol ER 80mg  daily Current Antidepressant medications: none Current Anticonvulsant medications: Topiramate 100 mg at bedtime Current anti-CGRP: none Current Vitamins/Herbal/Supplements: Folic acid Current Antihistamines/Decongestants: None Other therapy: clonazepam, buspirone Hormone/birth control: IUD (no change)   Caffeine: Mt Dew once in awhile to treat headache.  Diet: Eats healthy.  Drinks plenty water. Exercise: Routine Depression: No; Anxiety: Yes Other pain: No Sleep hygiene: Good   HISTORY: Onset:  Childhood Location:  Varies (frontal, either side) Quality:  pounding Initial Intensity:  10/10 Aura:  Sometimes sees spots in her vision prior to headache  onset Prodrome:  on Associated symptoms: Nausea, photophobia, phonophobia;  Vomiting when severe. Initial Duration:  Until she falls asleep.  Severe episodes last 1 to 3 days Initial Frequency:  Daily.  Severe episodes occur once a month to once every other month. Triggers/aggravating factors:  movement, stress Relieving factors:  Sleep, vomiting, hot shower Activity:  Cannot function when severe (once a month to once every other month)   Past NSAIDS: Advil, Advil migraine Past analgesics: Fioricet Past abortive triptans: Maxalt, sumatriptan tablet, Zomig 5 mg NS, Zomig 5 mg tablet, Relpax 40mg  Past abortive ergotamine:  none Past muscle relaxants:  none Past anti-emetic:  none Past antihypertensive medications: propranolol ER 60mg , verapamil Past antidepressant medications:  paroxetine Past anticonvulsant medications:  none Past anti-CGRP:  Ubrelvy 100mg , Nurtec (rescue), Zavzpret NS (ineffective) Past vitamins/Herbal/Supplements:  none Past antihistamines/decongestants:  none Other past therapies:  none   Family history of headache:  mom  PAST MEDICAL HISTORY: Past Medical History:  Diagnosis Date   Anxiety    Degenerative disc disease, lumbar    Depression    Headache    Painful menstrual periods     MEDICATIONS: Current Outpatient Medications on File Prior to Visit  Medication Sig Dispense Refill   ALPRAZolam (XANAX) 1 MG tablet Take 1 mg by mouth 3 (three) times daily as needed.     cyclobenzaprine (FLEXERIL) 10 MG tablet Take 5-10 mg by mouth at bedtime as needed.     eletriptan (RELPAX) 40 MG tablet Take 1 tablet (40 mg total) by mouth as needed for migraine or headache. May repeat in 2 hours if headache persists or recurs. 10 tablet 5   Fremanezumab-vfrm (AJOVY) 225 MG/1.5ML SOAJ Inject 225 mg into the skin every 28 (twenty-eight) days. 1.68 mL 5  ibuprofen (ADVIL) 800 MG tablet Take 1 tablet (800 mg total) by mouth every 8 (eight) hours as needed for mild pain or  moderate pain. 30 tablet 0   lisdexamfetamine (VYVANSE) 50 MG capsule Take by mouth.     magnesium oxide (MAG-OX) 400 (240 Mg) MG tablet Take 400 mg by mouth daily.     metroNIDAZOLE (FLAGYL) 500 MG tablet Take 1 tablet (500 mg total) by mouth 2 (two) times daily. 14 tablet 0   norgestimate-ethinyl estradiol (ORTHO-CYCLEN) 0.25-35 MG-MCG tablet Take 1 tablet by mouth daily. 30 tablet 11   oxyCODONE-acetaminophen (PERCOCET) 5-325 MG tablet Take 1-2 tablets by mouth every 4 (four) hours as needed for severe pain. 5 tablet 0   propranolol ER (INDERAL LA) 80 MG 24 hr capsule Take 1 capsule (80 mg total) by mouth daily. 30 capsule 5   topiramate (TOPAMAX) 100 MG tablet TAKE 1 TABLET(100 MG) BY MOUTH AT BEDTIME 30 tablet 5   No current facility-administered medications on file prior to visit.    ALLERGIES: No Known Allergies  FAMILY HISTORY: Family History  Problem Relation Age of Onset   Hypertension Maternal Grandmother    Hypertension Maternal Grandfather    Cancer Maternal Grandfather        lymphoma    Cancer Paternal Grandmother        ovarian    Aneurysm Paternal Grandfather    Rheum arthritis Mother    Hypertension Mother       Objective:   Vitals:   07/05/22 1304  BP: 115/70  Pulse: 90  SpO2: 98%   General: No acute distress.  Patient appears well-groomed.   Head:  Normocephalic/atraumatic Eyes:  Fundi examined but not visualized Neck: supple, no paraspinal tenderness, full range of motion Heart:  Regular rate and rhythm Lungs:  Clear to auscultation bilaterally Back: No paraspinal tenderness Neurological Exam: alert and oriented to person, place, and time.  Speech fluent and not dysarthric, language intact.  CN II-XII intact. Bulk and tone normal, muscle strength 5/5 throughout.  Sensation to light touch intact.  Deep tendon reflexes 2+ throughout, toes downgoing.  Finger to nose testing intact.  Gait normal, Romberg negative.   [name], DO  CC: [name], MD

## 2022-07-03 NOTE — Patient Instructions (Signed)

## 2022-07-04 ENCOUNTER — Encounter: Payer: Self-pay | Admitting: Certified Nurse Midwife

## 2022-07-05 ENCOUNTER — Encounter: Payer: Self-pay | Admitting: Certified Nurse Midwife

## 2022-07-05 ENCOUNTER — Ambulatory Visit: Payer: BC Managed Care – PPO | Admitting: Neurology

## 2022-07-05 ENCOUNTER — Encounter: Payer: Self-pay | Admitting: Neurology

## 2022-07-05 VITALS — BP 115/70 | HR 90 | Ht 63.0 in | Wt 184.8 lb

## 2022-07-05 DIAGNOSIS — G43009 Migraine without aura, not intractable, without status migrainosus: Secondary | ICD-10-CM | POA: Diagnosis not present

## 2022-07-05 MED ORDER — ELETRIPTAN HYDROBROMIDE 40 MG PO TABS: 40.0000 mg | ORAL_TABLET | ORAL | 5 refills | Status: AC | PRN

## 2022-07-05 MED ORDER — TOPIRAMATE 100 MG PO TABS: ORAL_TABLET | ORAL | 5 refills | Status: AC

## 2022-07-05 MED ORDER — PROPRANOLOL HCL ER 80 MG PO CP24: 80.0000 mg | ORAL_CAPSULE | Freq: Every day | ORAL | 5 refills | Status: AC

## 2022-07-05 NOTE — Patient Instructions (Signed)
Refill topiramate, propranolol and eletriptan Limit use of pain relievers to no more than 2 days out of week to prevent risk of rebound or medication-overuse headache. Keep headache diary

## 2022-07-06 ENCOUNTER — Other Ambulatory Visit: Payer: Self-pay | Admitting: Certified Nurse Midwife

## 2022-07-06 MED ORDER — FLUCONAZOLE 150 MG PO TABS: 150.0000 mg | ORAL_TABLET | Freq: Once | ORAL | 0 refills | Status: AC

## 2022-07-31 ENCOUNTER — Ambulatory Visit: Payer: BC Managed Care – PPO | Admitting: Certified Nurse Midwife

## 2022-07-31 ENCOUNTER — Encounter: Payer: Self-pay | Admitting: Certified Nurse Midwife

## 2022-07-31 VITALS — BP 116/77 | HR 85 | Wt 185.7 lb

## 2022-07-31 DIAGNOSIS — Z30431 Encounter for routine checking of intrauterine contraceptive device: Secondary | ICD-10-CM | POA: Diagnosis not present

## 2022-07-31 NOTE — Progress Notes (Signed)
    GYNECOLOGY OFFICE ENCOUNTER NOTE  History:  27 y.o. G0P0000 here today for today for IUD string check; Mirena  IUD was placed  07/03/22. No complaints about the IUD, no concerning side effects.  The following portions of the patient's history were reviewed and updated as appropriate: allergies, current medications, past family history, past medical history, past social history, past surgical history and problem list. Last pap smear on 03/26/21 was normal, negative HRHPV.  Review of Systems:  Pertinent items are noted in HPI.   Objective:  Physical Exam Blood pressure 116/77, pulse 85, weight 185 lb 11.2 oz (84.2 kg), last menstrual period 06/23/2022. CONSTITUTIONAL: Well-developed, well-nourished female in no acute distress.  NEUROLOGIC: Alert and oriented to person, place, and time. Normal reflexes, muscle tone coordination.  PSYCHIATRIC: Normal mood and affect. Normal behavior. Normal judgment and thought content. CARDIOVASCULAR: Normal heart rate noted RESPIRATORY: Effort and breath sounds normal, no problems with respiration noted ABDOMEN: Soft, no distention noted.   PELVIC: Normal appearing external genitalia; normal appearing vaginal mucosa and cervix.  IUD strings visualized, about 3 cm in length outside cervix. Done in the presence of a chaperone.   Assessment & Plan:  Patient to keep IUD in place for up to eight years; can come in for removal earlier if she desires or for any concerning side effects. Recommended condoms for STI prevention.  Doreene Burke, CNM

## 2022-08-03 IMAGING — CT CT FEMUR *R* W/ CM
2 of 3 series · 12 of 35 positions shown, 15 images · IV contrast (omnipaque)
Comparison: None.

CLINICAL DATA: Right proximal inner thigh abscess. Pain extending
towards the knee. Concern for deep soft tissue infection.

EXAM:
CT OF THE LOWER RIGHT EXTREMITY WITH CONTRAST
TECHNIQUE: Multidetector CT imaging of the lower right extremity was performed
according to the standard protocol following intravenous contrast
administration.
CONTRAST:  100mL OMNIPAQUE IOHEXOL 300 MG/ML  SOLN

[Series 7: axial st · axial · 0.57mm/px · z∈[+124,+582]mm · 9 of 362 slices shown, 12 images]
[im 28/362  soft-tissue]
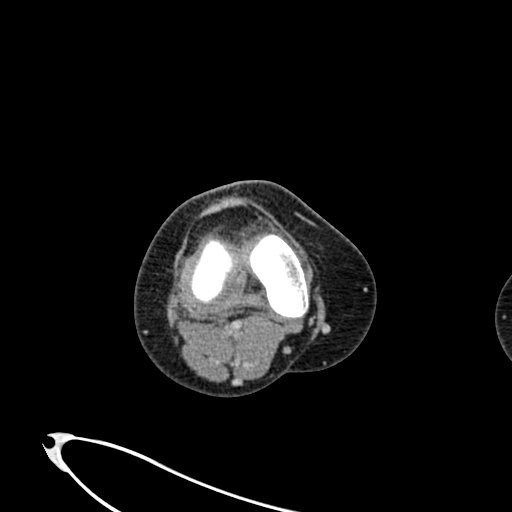
[im 28/362  bone]
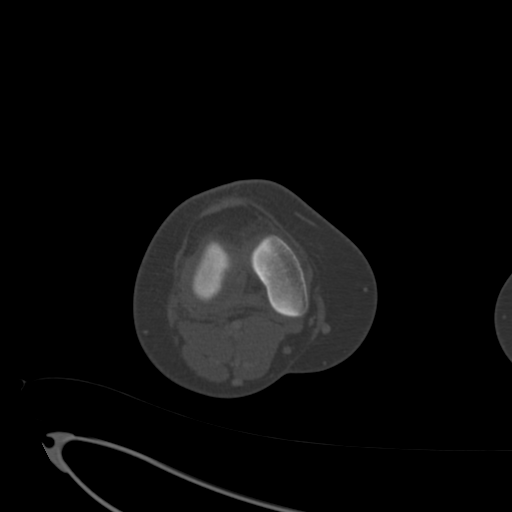
[im 84/362  bone]
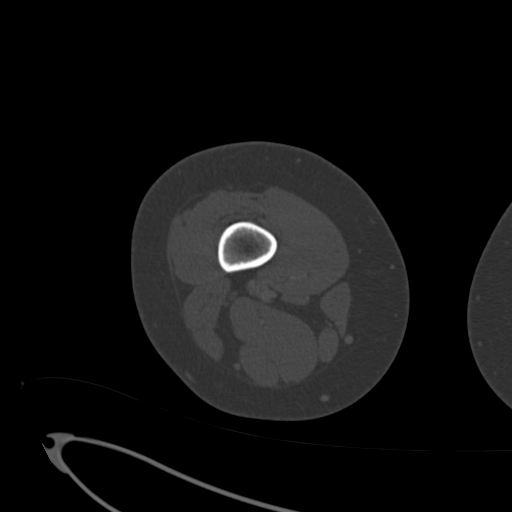
[im 112/362  bone]
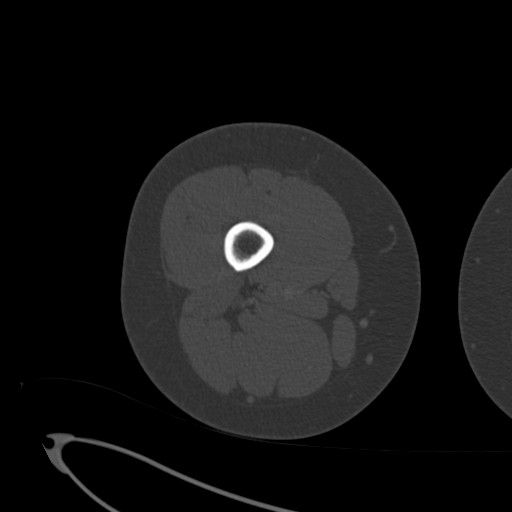
[im 139/362  bone]
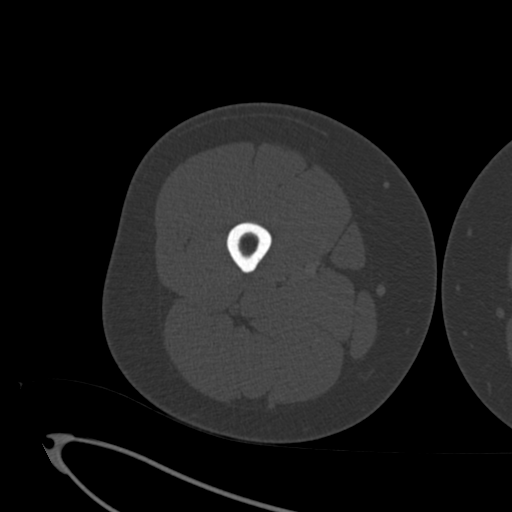
[im 195/362  soft-tissue]
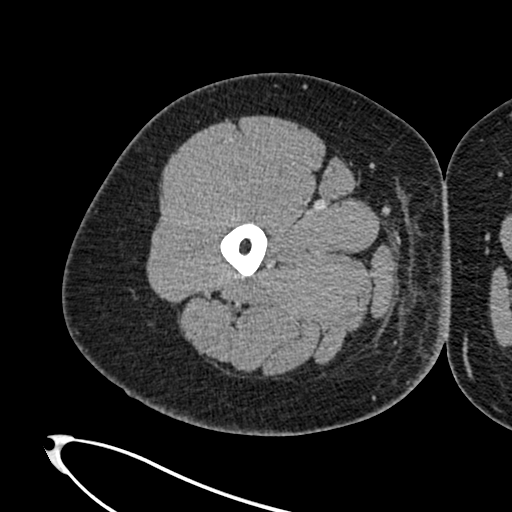
[im 195/362  bone]
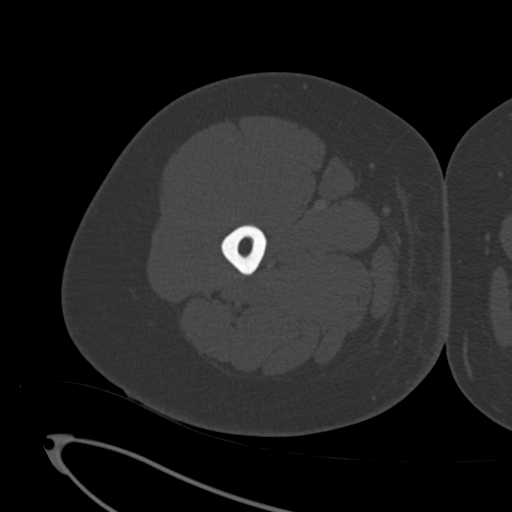
[im 223/362  bone]
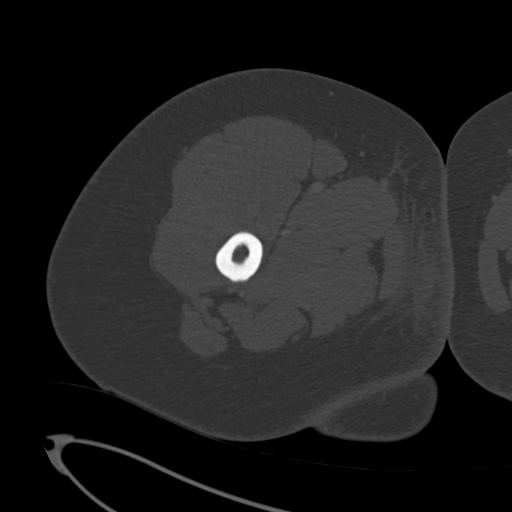
[im 250/362  bone]
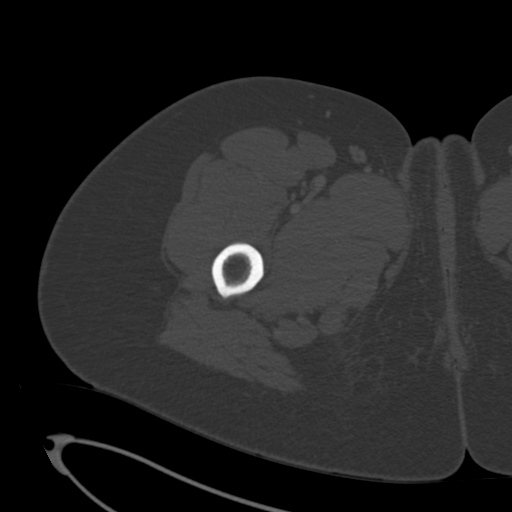
[im 306/362  bone]
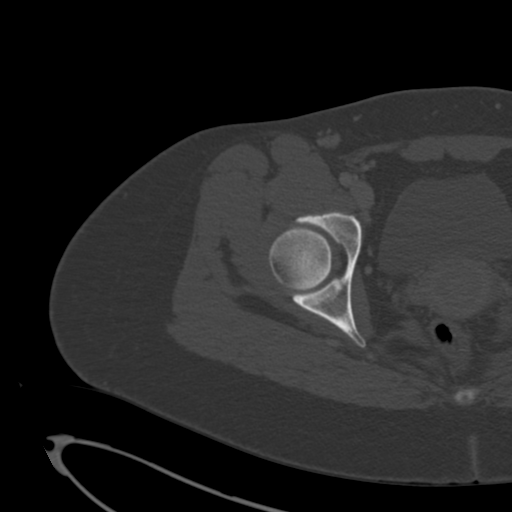
[im 334/362  soft-tissue]
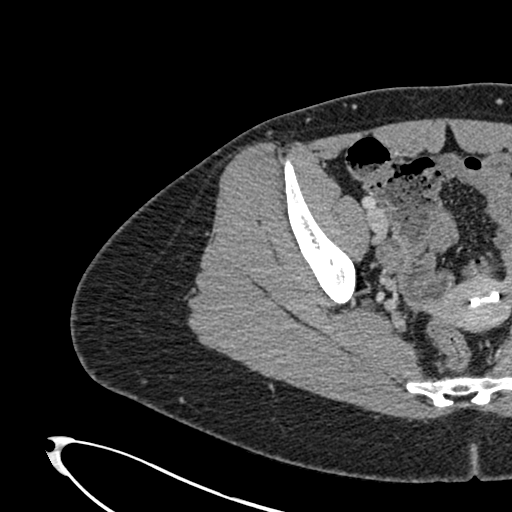
[im 334/362  bone]
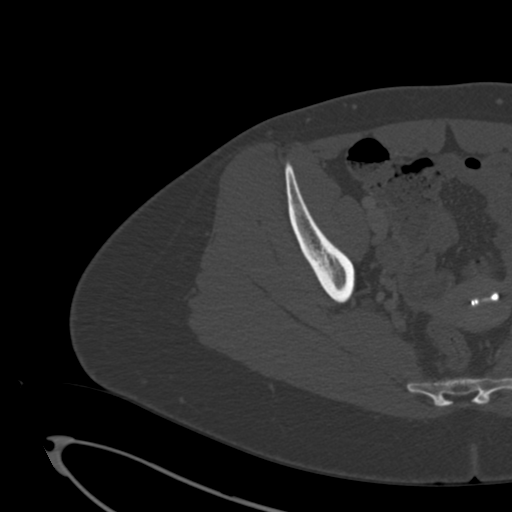

[Series 8: coronal st · coronal · 0.45mm/px · 3 of 148 slices shown]
[im 30/148  bone]
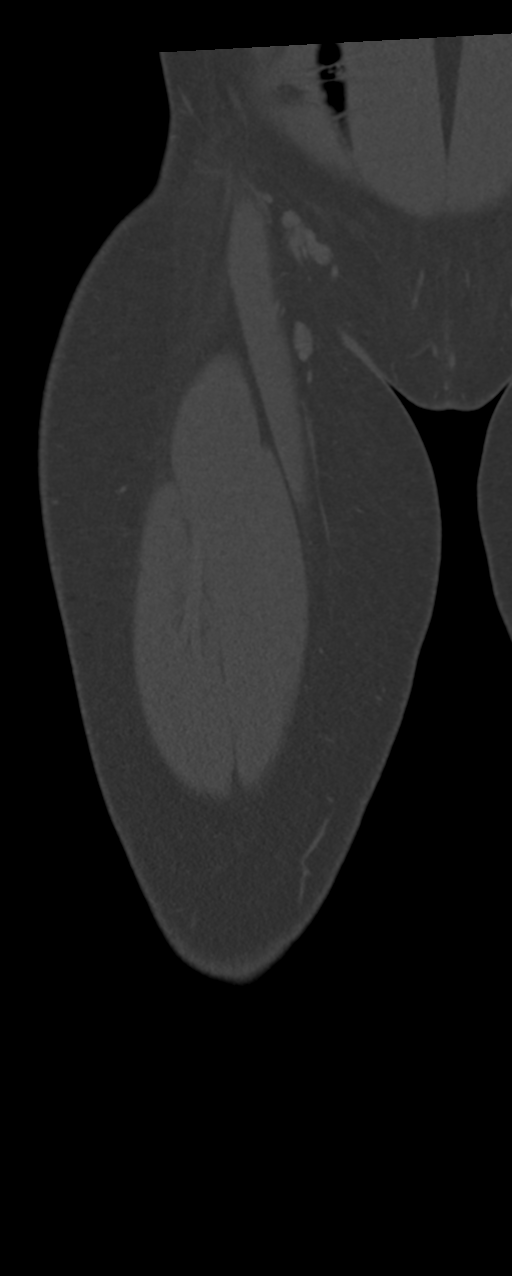
[im 59/148  bone]
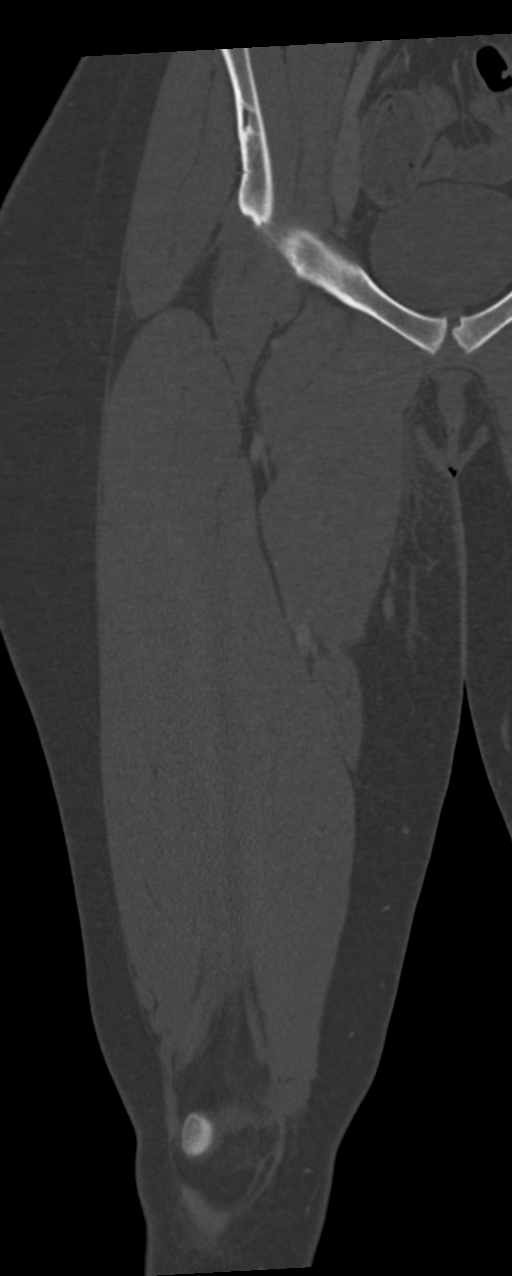
[im 89/148  bone]
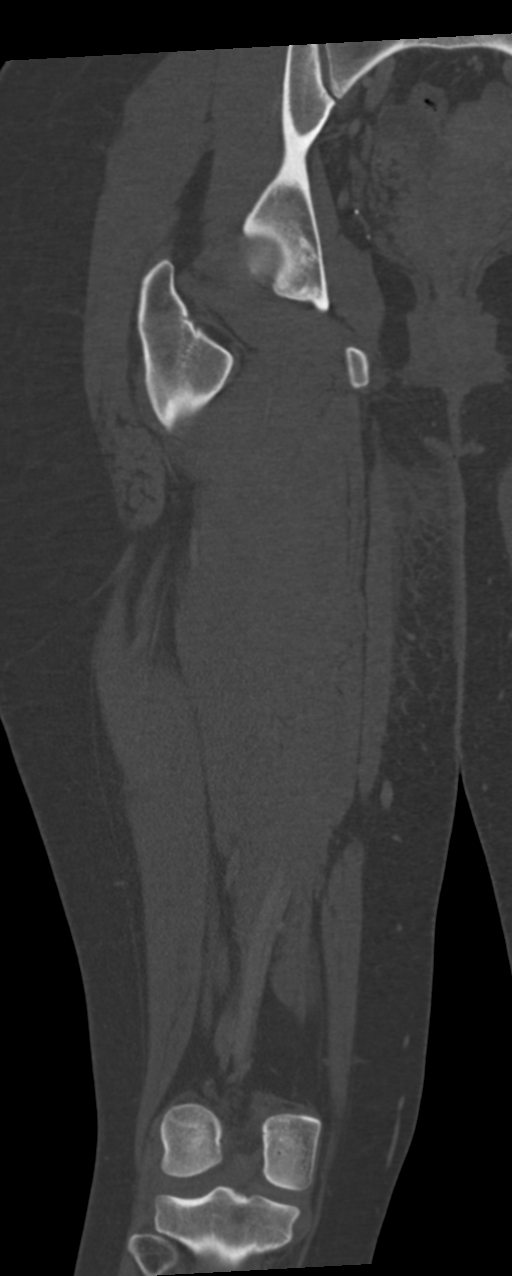

[12 of 35 positions shown; findings below may reference images not displayed]

FINDINGS: Bones/Joint/Cartilage

No fracture. No erosion or bony destruction. Hip and knee alignment
are maintained. No hip or knee joint effusion.

Ligaments

Suboptimally assessed by CT.

Muscles and Tendons

There is no intramuscular collection. Fat stranding of the upper
inner thigh extends to the superficial fascia adjacent to gracilis
muscle but no deep intramuscular or compartmental inflammation.
Normal fatty striations persist.

Soft tissues

Skin thickening with subcutaneous stranding in the upper inner
thigh. Inflammatory change extend to gracilis muscle but no deep
fascial extension. There is no peripherally enhancing or focal fluid
collection. No soft tissue air. Proximal extent approaches the
labia. Prominent right inguinal lymph nodes are typically reactive.
There is an IUD in the uterus.
IMPRESSION: 1. Skin thickening with subcutaneous stranding in the upper inner
thigh consistent with cellulitis. No discrete collection.
Inflammatory change extends to the superficial fascia adjacent to
gracilis muscle but no deep fascial extension or intramuscular
inflammation. No soft tissue air.
2. No acute osseous abnormality.

## 2022-08-28 ENCOUNTER — Ambulatory Visit: Payer: BC Managed Care – PPO | Admitting: Family Medicine

## 2022-08-28 DIAGNOSIS — Z708 Other sex counseling: Secondary | ICD-10-CM

## 2022-08-28 DIAGNOSIS — Z113 Encounter for screening for infections with a predominantly sexual mode of transmission: Secondary | ICD-10-CM

## 2022-08-28 DIAGNOSIS — Z114 Encounter for screening for human immunodeficiency virus [HIV]: Secondary | ICD-10-CM

## 2022-08-28 DIAGNOSIS — Z3009 Encounter for other general counseling and advice on contraception: Secondary | ICD-10-CM

## 2022-08-28 LAB — HM HIV SCREENING LAB: HM HIV Screening: NEGATIVE

## 2022-08-28 NOTE — Progress Notes (Signed)
STI clinic/screening visit [address] [place] [address] [phone]  Subjective:  Virginia Jackson is a 27 y.o. female being seen today for [facility]. The patient reports they do not have symptoms.    No LMP recorded. (Menstrual status: IUD).  Patient has the following medical conditions:   Patient Active Problem List   Diagnosis Date Noted   Anxiety 01/10/2021   Depression, recurrent (HCC) 01/10/2021   Abscess of right leg 08/15/2020   Secondary amenorrhea 10/25/2015   Family history of ovarian cancer 10/25/2015   Acne 01/04/2015   Migraine without aura and without status migrainosus, not intractable 12/14/2014    Chief Complaint  Patient presents with   SEXUALLY TRANSMITTED DISEASE    Screening    Does the patient using douching products? No  Last HIV test per patient/review of record was  Lab Results  Component Value Date   HMHIVSCREEN Negative - Validated 07/30/2021    Lab Results  Component Value Date   HIV Non Reactive 08/15/2020   Patient reports last pap was  Lab Results  Component Value Date   DIAGPAP  03/26/2021    - Negative for intraepithelial lesion or malignancy (NILM)   No results found for: "SPECADGYN"  Screening for MPX risk: Does the patient have an unexplained rash? No Is the patient MSM? No Does the patient endorse multiple sex partners or anonymous sex partners? No Did the patient have close or sexual contact with a person diagnosed with MPX? No Has the patient traveled outside the [place] where MPX is endemic? No Is there a high clinical suspicion for MPX-- evidenced by one of the following No  -Unlikely to be chickenpox  -Lymphadenopathy  -Rash that present in same phase of evolution on any given body part See flowsheet for further details and programmatic requirements.   Immunization history:  Immunization History  Administered Date(s) Administered   Hep A, Unspecified 03/25/2005, 08/10/2007   Hep B,  Unspecified 24-May-1995, 04/20/1996, 08/17/1996   Hepatitis A 03/25/2005, 08/10/2007   Hepatitis B 07-Jan-1996, 04/20/1996, 08/17/1996   Hpv-Unspecified 01/20/2013, 03/23/2013, 07/20/2013   Influenza-Unspecified 11/30/2020   Moderna Sars-Covid-2 Vaccination 05/31/2019, 06/28/2019   PPD Test 09/24/2019, 09/12/2020   Tdap 08/10/2007     The following portions of the patient's history were reviewed and updated as appropriate: allergies, current medications, past medical history, past social history, past surgical history and problem list.  Objective:  There were no vitals filed for this visit.  Patient seen by RN only. Self collected swabs.    Assessment and Plan:  Virginia Jackson is a 27 y.o. female presenting to the [facility] for STI screening in [facility]  1. Screening for human immunodeficiency virus  - HIV [facility]  2. Screening for venereal disease  - Chlamydia/Gonorrhea [facility] - Syphilis Serology,  Lab  3. Counseling on sexually transmitted disease   4. Family planning counseling - Pt is requesting an IUD string check today.  Patient was referred to her OB/GYN    Patient accepted all screenings including vaginal CT/GC and bloodwork for HIV/RPR. Patient meets criteria for HepB screening? No. Ordered? no Patient meets criteria for HepC screening? No. Ordered? no  Treat positive results per standing order.  Discussed time line for [facility] results and that patient will be called with positive results and encouraged patient to call if she had not heard in 2 weeks.  Recommended repeat testing in 3  months with positive results. Recommended condom use with all sex  Patient is currently using *Mirena to prevent pregnancy.    Return for Follow-up PRN.  Future Appointments  Date Time Provider Department Center  01/06/2023  8:50 AM Drema Dallas, DO [facility] None    Berdie Ogren, RN

## 2022-09-04 NOTE — Progress Notes (Signed)
Attestation of [facility]: Evaluation and management procedures were performed by the Registered Nurse under [facility] standing orders.  RN independently saw the patient and provided screenings for them without medical exam. Patient self collected specimens. I have reviewed the RN's note and chart, and I agree with screening ordered.   Oak Dorey, FNP  

## 2022-09-11 ENCOUNTER — Ambulatory Visit: Payer: 59 | Admitting: Neurology

## 2023-01-02 NOTE — Progress Notes (Deleted)
NEUROLOGY FOLLOW UP OFFICE NOTE  Virginia Jackson [id number]  Assessment/Plan:   Migraine without aura, without status migrainosus, not intractable - currently doing well, so I wouldn't add another medication at this time   Migraine prevention: Topiramate Jackson  QHS, propranolol ER 80mg  *** Migraine rescue:  Virginia.  May use Tylenol Migraine.  *** Limit use of pain relievers to no more than 2 days out of week to prevent risk of rebound or medication-overuse headache. Keep headache diary Follow up 6 months.  Subjective:  Virginia Jackson is a 27 year old right-handed female who follows up for migraines.   UPDATE: *** Intensity:  moderate to severe Duration:  maximum 2 hours with Virginia Frequency:  once every other week With her migraines, she notes more lightheadedness and sees "black spots" in her vision.      Current NSAIDS:  Tylenol Migraine Current analgesics: none Current triptans: Virginia Jackson  Current ergotamine: None Current anti-emetic: None Current muscle relaxants: None Current anti-anxiolytic: Virginia Jackson Current sleep aide: None Current Antihypertensive medications:  propranolol ER 80mg  daily Current Antidepressant medications: none Current Anticonvulsant medications: Topiramate 100 mg at bedtime Current anti-CGRP: none Current Vitamins/Herbal/Supplements: Folic acid Current Antihistamines/Decongestants: None Other therapy: Virginia Jackson, Virginia Jackson Hormone/birth control: IUD (no change)   Caffeine: Virginia Jackson once in awhile to treat headache.  Diet: Eats healthy.  Drinks plenty water. Exercise: Routine Depression: No; Anxiety: Yes Other pain: No Sleep hygiene: Good   HISTORY: Onset:  Childhood Location:  Varies (frontal, either side) Quality:  pounding Initial Intensity:  10/10 Aura:  Sometimes sees spots in her vision prior to headache onset Prodrome:  on Associated symptoms: Nausea, photophobia, phonophobia;  Vomiting when severe. Initial Duration:  Until she  falls asleep.  Severe episodes last 1 to 3 days Initial Frequency:  Daily.  Severe episodes occur once a month to once every other month. Triggers/aggravating factors:  movement, stress Relieving factors:  Sleep, vomiting, hot shower Activity:  Cannot function when severe (once a month to once every other month)   Past NSAIDS: Virginia Jackson, Virginia Jackson migraine Past analgesics: Virginia Jackson Past abortive triptans: Virginia Jackson, Virginia Jackson, Virginia Jackson, Virginia Jackson, Virginia Jackson  Past abortive ergotamine:  none Past muscle relaxants:  none Past anti-emetic:  none Past antihypertensive medications: propranolol ER 60mg , verapamil Past antidepressant medications:  Virginia Jackson Past anticonvulsant medications:  none Past anti-CGRP:  Virginia Jackson , Virginia (rescue), Virginia Jackson (ineffective) Past vitamins/Herbal/Supplements:  none Past antihistamines/decongestants:  none Other past therapies:  none   Family history of headache:  mom  PAST MEDICAL HISTORY: Past Medical History:  Diagnosis Date   Anxiety    Degenerative disc disease, lumbar    Depression    Headache    Painful menstrual periods     MEDICATIONS: Current Outpatient Medications on File Prior to Visit  Medication Sig Dispense Refill   ALPRAZolam (Virginia Jackson) 1 MG Jackson Take 1 mg by mouth 3 (three) times daily as needed.     eletriptan (Virginia) 40 MG Jackson Take 1 Jackson (40 mg total) by mouth as needed for migraine or headache. May repeat in 2 hours if headache persists or recurs. 10 Jackson 5   levonorgestrel (MIRENA) 20 MCG/DAY IUD 1 each by Intrauterine route once.     lisdexamfetamine (VYVANSE) 50 MG capsule Take by mouth.     Virginia Jackson Mg) MG Jackson Take 400 mg by mouth daily.     oxyCODONE-acetaminophen (PERCOCET) 5-325 MG Jackson Take 1-2 tablets by mouth every 4 (four) hours as needed  for severe pain. (Patient not taking: Reported on 08/28/2022) 5 Jackson 0   propranolol ER (INDERAL LA) 80 MG 24 hr capsule  Take 1 capsule (80 mg total) by mouth daily. 30 capsule 5   REXULTI 1 MG TABS Jackson Take 1 mg by mouth daily.     topiramate (TOPAMAX) 100 MG Jackson TAKE 1 Jackson(100 MG) BY MOUTH AT BEDTIME 30 Jackson 5   No current facility-administered medications on file prior to visit.    ALLERGIES: No Known Allergies  FAMILY HISTORY: Family History  Problem Relation Age of Onset   Hypertension Maternal Grandmother    Hypertension Maternal Grandfather    Cancer Maternal Grandfather        lymphoma    Cancer Paternal Grandmother        ovarian    Aneurysm Paternal Grandfather    Rheum arthritis Mother    Hypertension Mother       Objective:  ***  General: No acute distress.  Patient appears well-groomed.   Head:  Normocephalic/atraumatic Eyes:  Fundi examined but not visualized Neck: supple, no paraspinal tenderness, full range of motion Heart:  Regular rate and rhythm Neurological Exam: ***   [name], DO  CC: [name], MD

## 2023-01-06 ENCOUNTER — Ambulatory Visit: Payer: BC Managed Care – PPO | Admitting: Neurology

## 2023-02-17 NOTE — Progress Notes (Unsigned)
NEUROLOGY FOLLOW UP OFFICE NOTE  Virginia Jackson [id number]  Assessment/Plan:   Migraine without aura, without status migrainosus, not intractable    Migraine prevention: Topiramate 100mg  QHS, propranolol ER 80mg  *** Migraine rescue:  Relpax.  May use Tylenol Migraine.  *** Limit use of pain relievers to no more than 2 days out of week to prevent risk of rebound or medication-overuse headache. Keep headache diary Follow up 6 months.  Subjective:  Virginia Jackson is a 27 year old right-handed female who follows up for migraines.   UPDATE: *** Intensity:  moderate to severe Duration:  maximum 2 hours with Relpax Frequency:  once every other week With her migraines, she notes more lightheadedness and sees "black spots" in her vision.      Current NSAIDS:  Tylenol Migraine Current analgesics: none Current triptans: Relpax 40mg  Current ergotamine: None Current anti-emetic: None Current muscle relaxants: None Current anti-anxiolytic: Xanax Current sleep aide: None Current Antihypertensive medications:  propranolol ER 80mg  daily Current Antidepressant medications: none Current Anticonvulsant medications: Topiramate 100 mg at bedtime Current anti-CGRP: none Current Vitamins/Herbal/Supplements: Folic acid Current Antihistamines/Decongestants: None Other therapy: clonazepam, buspirone Hormone/birth control: IUD (no change)   Caffeine: Mt Dew once in awhile to treat headache.  Diet: Eats healthy.  Drinks plenty water. Exercise: Routine Depression: No; Anxiety: Yes Other pain: No Sleep hygiene: Good   HISTORY: Onset:  Childhood Location:  Varies (frontal, either side) Quality:  pounding Initial Intensity:  10/10 Aura:  Sometimes sees spots in her vision prior to headache onset Prodrome:  on Associated symptoms: Nausea, photophobia, phonophobia;  Vomiting when severe. Initial Duration:  Until she falls asleep.  Severe episodes last 1 to 3 days Initial Frequency:  Daily.   Severe episodes occur once a month to once every other month. Triggers/aggravating factors:  movement, stress Relieving factors:  Sleep, vomiting, hot shower Activity:  Cannot function when severe (once a month to once every other month)   Past NSAIDS: Advil, Advil migraine Past analgesics: Fioricet Past abortive triptans: Maxalt, sumatriptan tablet, Zomig 5 mg NS, Zomig 5 mg tablet, Relpax 40mg  Past abortive ergotamine:  none Past muscle relaxants:  none Past anti-emetic:  none Past antihypertensive medications: propranolol ER 60mg , verapamil Past antidepressant medications:  paroxetine Past anticonvulsant medications:  none Past anti-CGRP:  Ubrelvy 100mg , Nurtec (rescue), Zavzpret NS (ineffective) Past vitamins/Herbal/Supplements:  none Past antihistamines/decongestants:  none Other past therapies:  none   Family history of headache:  mom  PAST MEDICAL HISTORY: Past Medical History:  Diagnosis Date   Anxiety    Degenerative disc disease, lumbar    Depression    Headache    Painful menstrual periods     MEDICATIONS: Current Outpatient Medications on File Prior to Visit  Medication Sig Dispense Refill   ALPRAZolam (XANAX) 1 MG tablet Take 1 mg by mouth 3 (three) times daily as needed.     eletriptan (RELPAX) 40 MG tablet Take 1 tablet (40 mg total) by mouth as needed for migraine or headache. May repeat in 2 hours if headache persists or recurs. 10 tablet 5   levonorgestrel (MIRENA) 20 MCG/DAY IUD 1 each by Intrauterine route once.     lisdexamfetamine (VYVANSE) 50 MG capsule Take by mouth.     magnesium oxide (MAG-OX) 400 (240 Mg) MG tablet Take 400 mg by mouth daily.     oxyCODONE-acetaminophen (PERCOCET) 5-325 MG tablet Take 1-2 tablets by mouth every 4 (four) hours as needed for severe pain. (Patient not taking: Reported on 08/28/2022) 5 tablet 0  propranolol ER (INDERAL LA) 80 MG 24 hr capsule Take 1 capsule (80 mg total) by mouth daily. 30 capsule 5   REXULTI 1 MG TABS  tablet Take 1 mg by mouth daily.     topiramate (TOPAMAX) 100 MG tablet TAKE 1 TABLET(100 MG) BY MOUTH AT BEDTIME 30 tablet 5   No current facility-administered medications on file prior to visit.    ALLERGIES: No Known Allergies  FAMILY HISTORY: Family History  Problem Relation Age of Onset   Hypertension Maternal Grandmother    Hypertension Maternal Grandfather    Cancer Maternal Grandfather        lymphoma    Cancer Paternal Grandmother        ovarian    Aneurysm Paternal Grandfather    Rheum arthritis Mother    Hypertension Mother       Objective:  *** General: No acute distress.  Patient appears well-groomed.   Head:  Normocephalic/atraumatic Eyes:  Fundi examined but not visualized Neck: supple, no paraspinal tenderness, full range of motion Heart:  Regular rate and rhythm Neurological Exam: ***   [name], DO  CC: [name], MD

## 2023-02-18 ENCOUNTER — Ambulatory Visit: Payer: BC Managed Care – PPO | Admitting: Neurology

## 2023-02-18 ENCOUNTER — Encounter: Payer: Self-pay | Admitting: Neurology

## 2023-02-18 VITALS — BP 103/71 | HR 86 | Ht 63.0 in | Wt 170.4 lb

## 2023-02-18 DIAGNOSIS — G43009 Migraine without aura, not intractable, without status migrainosus: Secondary | ICD-10-CM | POA: Diagnosis not present

## 2023-06-19 ENCOUNTER — Encounter: Payer: Self-pay | Admitting: Certified Nurse Midwife

## 2023-06-19 ENCOUNTER — Other Ambulatory Visit (HOSPITAL_COMMUNITY)
Admission: RE | Admit: 2023-06-19 | Discharge: 2023-06-19 | Disposition: A | Discharge status: Home / Self Care | Source: Ambulatory Visit | Attending: Certified Nurse Midwife | Admitting: Certified Nurse Midwife

## 2023-06-19 ENCOUNTER — Ambulatory Visit (INDEPENDENT_AMBULATORY_CARE_PROVIDER_SITE_OTHER): Payer: BC Managed Care – PPO | Admitting: Certified Nurse Midwife

## 2023-06-19 VITALS — BP 110/73 | HR 84 | Ht 63.0 in | Wt 171.5 lb

## 2023-06-19 DIAGNOSIS — R61 Generalized hyperhidrosis: Secondary | ICD-10-CM | POA: Insufficient documentation

## 2023-06-19 DIAGNOSIS — L750 Bromhidrosis: Secondary | ICD-10-CM

## 2023-06-19 DIAGNOSIS — Z113 Encounter for screening for infections with a predominantly sexual mode of transmission: Secondary | ICD-10-CM | POA: Diagnosis present

## 2023-06-19 DIAGNOSIS — Z01419 Encounter for gynecological examination (general) (routine) without abnormal findings: Secondary | ICD-10-CM | POA: Diagnosis not present

## 2023-06-19 NOTE — Progress Notes (Signed)
 GYNECOLOGY ANNUAL PREVENTATIVE CARE ENCOUNTER NOTE  History:     Virginia Jackson is a 28 y.o. G0P0000 female here for a routine annual gynecologic exam.  Current complaints: pt state she sweats a lot and smells like BO in her pubic area despite taking sometimes multiple showers a day..   Denies abnormal vaginal bleeding, discharge, pelvic pain, problems with intercourse or other gynecologic concerns.     Social Relationship: Single Living: alone Work:full time Licensed Visual merchandiser, starting new job at [employer] Exercise:2 a week walking dog Smoke/Alcohol/drug use: No tobacco use/ occasional alcohol use/ no history of drug use.   Gynecologic History No LMP recorded. (Menstrual status: IUD). Contraception: IUD Last Pap: 03/26/2021. Results were: normal    Obstetric History OB History  Gravida Para Term Preterm AB Living  0 0 0 0 0 0  SAB IAB Ectopic Multiple Live Births  0 0 0 0     Past Medical History:  Diagnosis Date   Anxiety    Degenerative disc disease, lumbar    Depression    Headache    Painful menstrual periods     Past Surgical History:  Procedure Laterality Date   INCISION AND DRAINAGE ABSCESS N/A 08/18/2020   Procedure: INCISION AND DRAINAGE ABSCESS;  Surgeon: Sung Amabile, DO;  Location: [facility] ORS;  Service: General;  Laterality: N/A;    Current Outpatient Medications on File Prior to Visit  Medication Sig Dispense Refill   ALPRAZolam (XANAX) 1 MG tablet Take 1 mg by mouth 3 (three) times daily as needed.     eletriptan (RELPAX) 40 MG tablet Take 1 tablet (40 mg total) by mouth as needed for migraine or headache. May repeat in 2 hours if headache persists or recurs. 10 tablet 5   levonorgestrel (MIRENA) 20 MCG/DAY IUD 1 each by Intrauterine route once.     lisdexamfetamine (VYVANSE) 50 MG capsule Take by mouth.     magnesium oxide (MAG-OX) 400 (240 Mg) MG tablet Take 400 mg by mouth daily.     propranolol ER (INDERAL LA) 80 MG  24 hr capsule Take 1 capsule (80 mg total) by mouth daily. 30 capsule 5   topiramate (TOPAMAX) 100 MG tablet TAKE 1 TABLET(100 MG) BY MOUTH AT BEDTIME 30 tablet 5   ARIPiprazole (ABILIFY) 5 MG tablet Take 5 mg by mouth daily.     No current facility-administered medications on file prior to visit.    No Known Allergies  Social History:  reports that she has never smoked. She has never used smokeless tobacco. She reports that she does not currently use alcohol. She reports that she does not use drugs.  Family History  Problem Relation Age of Onset   Hypertension Maternal Grandmother    Hypertension Maternal Grandfather    Cancer Maternal Grandfather        lymphoma    Cancer Paternal Grandmother        ovarian    Aneurysm Paternal Grandfather    Rheum arthritis Mother    Hypertension Mother     The following portions of the patient's history were reviewed and updated as appropriate: allergies, current medications, past family history, past medical history, past social history, past surgical history and problem list.  Review of Systems Pertinent items noted in HPI and remainder of comprehensive ROS otherwise negative.  Physical Exam:  BP 110/73   Pulse 84   Ht 5\' 3"  (1.6 m)   Wt 171 lb  8 oz (77.8 kg)   BMI 30.38 kg/m  CONSTITUTIONAL: Well-developed, well-nourished female in no acute distress.  HENT:  Normocephalic, atraumatic, External right and left ear normal. Oropharynx is clear and moist EYES: Conjunctivae and EOM are normal. Pupils are equal, round, and reactive to light. No scleral icterus.  NECK: Normal range of motion, supple, no masses.  Normal thyroid.  SKIN: Skin is warm and dry. No rash noted. Not diaphoretic. No erythema. No pallor. MUSCULOSKELETAL: Normal range of motion. No tenderness.  No cyanosis, clubbing, or edema.  2+ distal pulses. NEUROLOGIC: Alert and oriented to person, place, and time. Normal reflexes, muscle tone coordination.  PSYCHIATRIC: Normal  mood and affect. Normal behavior. Normal judgment and thought content. CARDIOVASCULAR: Normal heart rate noted, regular rhythm RESPIRATORY: Clear to auscultation bilaterally. Effort and breath sounds normal, no problems with respiration noted. BREASTS: Symmetric in size. No masses, tenderness, skin changes, nipple drainage, or lymphadenopathy bilaterally.  ABDOMEN: Soft, no distention noted.  No tenderness, rebound or guarding.  PELVIC: Normal appearing external genitalia and urethral meatus; normal appearing vaginal mucosa and cervix.  No abnormal discharge noted.  Pap smear not due, swab collected. Strings present. .  Normal uterine size, no other palpable masses, no uterine or adnexal tenderness.  .   Assessment and Plan:  Annual Well Women GYN exam   Pap: not due  Mammogram : n/a  Labs: vaginal swab Refills: n/a  Referral:  Routine preventative health maintenance measures emphasized. Please refer to After Visit Summary for other counseling recommendations.      Doreene Burke, CNM [facility]  [facility],  [facility]

## 2023-06-24 LAB — CERVICOVAGINAL ANCILLARY ONLY
Bacterial Vaginitis (gardnerella): NEGATIVE
Candida Glabrata: NEGATIVE
Candida Vaginitis: NEGATIVE
Chlamydia: NEGATIVE
Comment: NEGATIVE
Comment: NEGATIVE
Comment: NEGATIVE
Comment: NEGATIVE
Comment: NEGATIVE
Comment: NORMAL
Neisseria Gonorrhea: NEGATIVE
Trichomonas: NEGATIVE

## 2024-02-23 ENCOUNTER — Ambulatory Visit: Payer: BC Managed Care – PPO | Admitting: Neurology

## 2024-03-17 NOTE — Progress Notes (Unsigned)
 "  NEUROLOGY FOLLOW UP OFFICE NOTE  WILBERT SCHOUTEN [id number]  Assessment/Plan:   Migraine without aura, without status migrainosus, not intractable    Migraine prevention: Topiramate  100mg  QHS, propranolol  ER 80mg   Migraine rescue:  Tylenol  first line, Relpax  second line Limit use of pain relievers to no more than 2 days out of week to prevent risk of rebound or medication-overuse headache. Keep headache diary Follow up 1 year  Subjective:  Virginia Jackson is a 29 year old right-handed female who follows up for migraines.   UPDATE: Doing well Intensity:  moderate to severe Duration:  maximum 2 hours with Relpax  Frequency:  2 days a month With her migraines, she notes more lightheadedness and sees black spots in her vision.     Rescue protocol:  Tylenol  first line, Relpax  second line Current NSAIDS:  Tylenol  Migraine Current analgesics: none Current triptans: Relpax  40mg  Current ergotamine: None Current anti-emetic: None Current muscle relaxants: None Current anti-anxiolytic: Xanax  Current sleep aide: None Current Antihypertensive medications:  propranolol  ER 80mg  daily Current Antidepressant medications: Rexulti 1mg  daily Current Anticonvulsant medications: Topiramate  100 mg at bedtime Current anti-CGRP: none Current Vitamins/Herbal/Supplements: mag ox 400mg  Current Antihistamines/Decongestants: None Hormone/birth control: Mirena  Other medications:  Vyvanse   Caffeine : Mt Dew once in awhile to treat headache.  Diet: Eats healthy.  Drinks plenty water. Exercise: Routine Depression: No; Anxiety: Yes Other pain: No Sleep hygiene: Good   HISTORY: Onset:  Childhood Location:  Varies (frontal, either side) Quality:  pounding Initial Intensity:  10/10 Aura:  Sometimes sees spots in her vision prior to headache onset Prodrome:  on Associated symptoms: Nausea, photophobia, phonophobia;  Vomiting when severe. Initial Duration:  Until she falls asleep.  Severe  episodes last 1 to 3 days Initial Frequency:  Daily.  Severe episodes occur once a month to once every other month. Triggers/aggravating factors:  movement, stress Relieving factors:  Sleep, vomiting, hot shower Activity:  Cannot function when severe (once a month to once every other month)   Past NSAIDS: Advil , Advil  migraine Past analgesics: Fioricet  Past abortive triptans: Maxalt, sumatriptan tablet, Zomig  5 mg NS, Zomig  5 mg tablet, Relpax  40mg  Past abortive ergotamine:  none Past muscle relaxants:  none Past anti-emetic:  none Past antihypertensive medications: propranolol  ER 60mg , verapamil  Past antidepressant medications:  paroxetine Past anticonvulsant medications:  none Past anti-CGRP:  Ubrelvy  100mg , Nurtec (rescue), Zavzpret NS (ineffective) Past vitamins/Herbal/Supplements:  none Past antihistamines/decongestants:  none Other past therapies:  none   Family history of headache:  mom  PAST MEDICAL HISTORY: Past Medical History:  Diagnosis Date   Anxiety    Degenerative disc disease, lumbar    Depression    Headache    Painful menstrual periods     MEDICATIONS: Current Outpatient Medications on File Prior to Visit  Medication Sig Dispense Refill   ALPRAZolam  (XANAX ) 1 MG tablet Take 1 mg by mouth 3 (three) times daily as needed.     ARIPiprazole (ABILIFY) 5 MG tablet Take 5 mg by mouth daily.     eletriptan  (RELPAX ) 40 MG tablet Take 1 tablet (40 mg total) by mouth as needed for migraine or headache. May repeat in 2 hours if headache persists or recurs. 10 tablet 5   levonorgestrel  (MIRENA ) 20 MCG/DAY IUD 1 each by Intrauterine route once.     lisdexamfetamine (VYVANSE) 50 MG capsule Take by mouth.     magnesium oxide (MAG-OX) 400 (240 Mg) MG tablet Take 400 mg by mouth daily.     propranolol  ER (INDERAL  LA)  80 MG 24 hr capsule Take 1 capsule (80 mg total) by mouth daily. 30 capsule 5   topiramate  (TOPAMAX ) 100 MG tablet TAKE 1 TABLET(100 MG) BY MOUTH AT BEDTIME 30  tablet 5   No current facility-administered medications on file prior to visit.    ALLERGIES: No Known Allergies  FAMILY HISTORY: Family History  Problem Relation Age of Onset   Hypertension Maternal Grandmother    Hypertension Maternal Grandfather    Cancer Maternal Grandfather        lymphoma    Cancer Paternal Grandmother        ovarian    Aneurysm Paternal Grandfather    Rheum arthritis Mother    Hypertension Mother       Objective:  *** General: No acute distress.  Patient appears well-groomed.   Head:  Normocephalic/atraumatic Neck:  Supple.  No paraspinal tenderness.  Full range of motion. Heart:  Regular rate and rhythm. Neuro:  Alert and oriented.  Speech fluent and not dysarthric.  Language intact.  CN II-XII intact.  Bulk and tone normal.  Muscle strength 5/5 throughout.  Sensation to light touch intact.  Deep tendon reflexes 2+ throughout, toes downgoing.  Gait normal.  Romberg negative.    Juliene Dunnings, DO  CC: Reyes Costa, MD ***       "

## 2024-03-18 ENCOUNTER — Ambulatory Visit: Admitting: Neurology

## 2024-04-29 ENCOUNTER — Ambulatory Visit: Payer: Self-pay | Admitting: Neurology

## 2024-06-24 ENCOUNTER — Ambulatory Visit: Admitting: Certified Nurse Midwife
